# Patient Record
Sex: Female | Born: 1965
Health system: Southern US, Community
[De-identification: ages and names within clinical notes are randomized; demographics above are authoritative.]

## PROBLEM LIST (undated history)

## (undated) DIAGNOSIS — K5904 Chronic idiopathic constipation: Secondary | ICD-10-CM

## (undated) DIAGNOSIS — E669 Obesity, unspecified: Secondary | ICD-10-CM

## (undated) DIAGNOSIS — E785 Hyperlipidemia, unspecified: Secondary | ICD-10-CM

## (undated) DIAGNOSIS — I10 Essential (primary) hypertension: Secondary | ICD-10-CM

## (undated) DIAGNOSIS — R002 Palpitations: Secondary | ICD-10-CM

## (undated) DIAGNOSIS — E079 Disorder of thyroid, unspecified: Secondary | ICD-10-CM

## (undated) DIAGNOSIS — O009 Unspecified ectopic pregnancy without intrauterine pregnancy: Secondary | ICD-10-CM

## (undated) DIAGNOSIS — N393 Stress incontinence (female) (male): Secondary | ICD-10-CM

## (undated) HISTORY — DX: Unspecified ectopic pregnancy without intrauterine pregnancy: O00.90

## (undated) HISTORY — PX: DOPPLER ECHOCARDIOGRAPHY: SHX263

## (undated) HISTORY — DX: Essential (primary) hypertension: I10

## (undated) HISTORY — DX: Hyperlipidemia, unspecified: E78.5

## (undated) HISTORY — PX: OTHER SURGICAL HISTORY: SHX169

## (undated) HISTORY — DX: Chronic idiopathic constipation: K59.04

## (undated) HISTORY — DX: Obesity, unspecified: E66.9

## (undated) HISTORY — DX: Palpitations: R00.2

## (undated) HISTORY — PX: CHOLECYSTECTOMY: SHX55

---

## 1988-08-14 HISTORY — PX: LAPAROSCOPIC ENDOMETRIOSIS FULGURATION: SUR769

## 1999-05-25 ENCOUNTER — Other Ambulatory Visit: Admission: RE | Admit: 1999-05-25 | Discharge: 1999-05-25 | Payer: Self-pay | Admitting: Gynecology

## 2000-06-01 ENCOUNTER — Other Ambulatory Visit: Admission: RE | Admit: 2000-06-01 | Discharge: 2000-06-01 | Payer: Self-pay | Admitting: Gynecology

## 2001-02-19 ENCOUNTER — Ambulatory Visit (HOSPITAL_COMMUNITY): Admission: RE | Admit: 2001-02-19 | Discharge: 2001-02-19 | Payer: Self-pay | Admitting: Internal Medicine

## 2001-02-19 ENCOUNTER — Encounter: Payer: Self-pay | Admitting: Internal Medicine

## 2001-06-04 ENCOUNTER — Other Ambulatory Visit: Admission: RE | Admit: 2001-06-04 | Discharge: 2001-06-04 | Payer: Self-pay | Admitting: Gynecology

## 2002-07-15 ENCOUNTER — Other Ambulatory Visit: Admission: RE | Admit: 2002-07-15 | Discharge: 2002-07-15 | Payer: Self-pay | Admitting: Gynecology

## 2004-05-23 ENCOUNTER — Other Ambulatory Visit: Admission: RE | Admit: 2004-05-23 | Discharge: 2004-05-23 | Payer: Self-pay | Admitting: Gynecology

## 2004-08-18 ENCOUNTER — Ambulatory Visit (HOSPITAL_COMMUNITY): Admission: RE | Admit: 2004-08-18 | Discharge: 2004-08-18 | Payer: Self-pay | Admitting: Gynecology

## 2004-09-18 ENCOUNTER — Emergency Department (HOSPITAL_COMMUNITY): Admission: EM | Admit: 2004-09-18 | Discharge: 2004-09-18 | Payer: Self-pay | Admitting: Family Medicine

## 2005-06-20 ENCOUNTER — Ambulatory Visit (HOSPITAL_COMMUNITY): Admission: RE | Admit: 2005-06-20 | Discharge: 2005-06-20 | Payer: Self-pay | Admitting: Family Medicine

## 2006-03-12 ENCOUNTER — Ambulatory Visit (HOSPITAL_COMMUNITY): Admission: RE | Admit: 2006-03-12 | Discharge: 2006-03-12 | Payer: Self-pay | Admitting: Family Medicine

## 2006-07-24 ENCOUNTER — Inpatient Hospital Stay (HOSPITAL_COMMUNITY): Admission: RE | Admit: 2006-07-24 | Discharge: 2006-07-25 | Payer: Self-pay | Admitting: Neurosurgery

## 2006-08-06 ENCOUNTER — Other Ambulatory Visit: Admission: RE | Admit: 2006-08-06 | Discharge: 2006-08-06 | Payer: Self-pay | Admitting: Gynecology

## 2006-08-20 ENCOUNTER — Ambulatory Visit (HOSPITAL_COMMUNITY): Admission: RE | Admit: 2006-08-20 | Discharge: 2006-08-20 | Payer: Self-pay | Admitting: Gynecology

## 2006-09-07 ENCOUNTER — Ambulatory Visit (HOSPITAL_COMMUNITY): Admission: RE | Admit: 2006-09-07 | Discharge: 2006-09-07 | Payer: Self-pay | Admitting: Family Medicine

## 2007-10-31 ENCOUNTER — Ambulatory Visit (HOSPITAL_COMMUNITY): Admission: RE | Admit: 2007-10-31 | Discharge: 2007-10-31 | Payer: Self-pay | Admitting: Family Medicine

## 2009-10-25 ENCOUNTER — Emergency Department (HOSPITAL_COMMUNITY): Admission: EM | Admit: 2009-10-25 | Discharge: 2009-10-25 | Payer: Self-pay | Admitting: Emergency Medicine

## 2010-02-17 ENCOUNTER — Ambulatory Visit (HOSPITAL_COMMUNITY): Admission: RE | Admit: 2010-02-17 | Discharge: 2010-02-17 | Payer: Self-pay | Admitting: Family Medicine

## 2010-03-28 ENCOUNTER — Ambulatory Visit: Payer: Self-pay | Admitting: Gastroenterology

## 2010-03-28 ENCOUNTER — Encounter: Payer: Self-pay | Admitting: Urgent Care

## 2010-03-28 DIAGNOSIS — R109 Unspecified abdominal pain: Secondary | ICD-10-CM | POA: Insufficient documentation

## 2010-03-28 DIAGNOSIS — K5909 Other constipation: Secondary | ICD-10-CM | POA: Insufficient documentation

## 2010-03-28 DIAGNOSIS — I1A0 Resistant hypertension: Secondary | ICD-10-CM | POA: Insufficient documentation

## 2010-03-28 DIAGNOSIS — I10 Essential (primary) hypertension: Secondary | ICD-10-CM | POA: Insufficient documentation

## 2010-03-30 LAB — CONVERTED CEMR LAB
Eosinophils Absolute: 0.2 10*3/uL (ref 0.0–0.7)
HCT: 39.2 % (ref 36.0–46.0)
Lymphocytes Relative: 35 % (ref 12–46)
MCV: 99.7 fL (ref 78.0–100.0)
Monocytes Relative: 5 % (ref 3–12)
Neutrophils Relative %: 57 % (ref 43–77)
Platelets: 293 10*3/uL (ref 150–400)
RBC: 3.93 M/uL (ref 3.87–5.11)
RDW: 13.5 % (ref 11.5–15.5)
WBC: 7.1 10*3/uL (ref 4.0–10.5)

## 2010-04-08 ENCOUNTER — Encounter: Payer: Self-pay | Admitting: Gastroenterology

## 2010-04-12 ENCOUNTER — Ambulatory Visit (HOSPITAL_COMMUNITY): Admission: RE | Admit: 2010-04-12 | Discharge: 2010-04-12 | Payer: Self-pay | Admitting: Gastroenterology

## 2010-04-12 ENCOUNTER — Ambulatory Visit: Payer: Self-pay | Admitting: Gastroenterology

## 2010-09-13 NOTE — Letter (Signed)
Summary: TCS ORDER  TCS ORDER   Imported By: Rosine Beat 04/08/2010 14:29:05  _____________________________________________________________________  External Attachment:    Type:   Image     Comment:   External Document

## 2010-09-13 NOTE — Assessment & Plan Note (Signed)
Summary: ABD PAIN.SS   Visit Type:  Initial Consult Referring Provider:  Lenell Antu, PA Primary Care Provider:  Phillips Odor  Chief Complaint:  abd pain.  History of Present Illness: 45 y/o black female w/ RLQ/Right flank constant pain x over 1 year.  Seen GYN Dr Thamas Jaegers and had ultrasounds and has hx of endometriosis.  CT Abd/pelvis w/ IV/oral contrast on 02/17/10->small amt free pelvic fluid. Pain 10/10 at worst, worse before periods.  Constant dull aching with doubling over sharp pain intermittantly.  No aggrevating factors.  Not assoc w/ eating.  Pain worse w/defecating.  BMs once weekly, don't like to have BM due to pain.  Denies rectal bleeding or melena. Tried fiber and eating activia yogurt.  Naproxen 2-3 times per week.  Occ nausea, no vomiting.  Denies fever.  Wt loss feels intentional 25# x 6 months.  Pt notes changed diet to DASH diet and walking 1 hr every other day.  Denies heartburn, indigestion, dyysphagia, or odynophagia.    Current Problems (verified): 1)  Abdominal Pain  (ICD-789.00) 2)  Constipation, Chronic  (ICD-564.09) 3)  Hypertension  (ICD-401.9)  Current Medications (verified): 1)  Losartan Potassium-Hctz 100-25 Mg Tabs (Losartan Potassium-Hctz) .... Take 1 Tablet By Mouth Once A Day 2)  Naproxen .... As Needed  Allergies (verified): 1)  ! Doxycycline  Past History:  Past Medical History: CONSTIPATION, CHRONIC (ICD-564.09) HYPERTENSION (ICD-401.9)  Past Surgical History: Dr Thamas Jaegers laparoscopy endometriosis 1980s cholecystectomy 12-30-1996 Dr Lovell Sheehan ectopic pregnancy s/p tubal ligation 1995  Family History: No known family history of colorectal carcinoma, IBD, liver or chronic GI problems. m 2063[birth year]-31) healthy f (deceased 58) MVA 2 brothers/2 sisters healthy  Social History: divorced [employer] 2 daughters 83 & 26 Patient has never smoked.  Alcohol Use - no Illicit Drug Use - no Patient gets regular exercise. Smoking Status:  never Drug Use:   no Does Patient Exercise:  yes  Review of Systems General:  Denies fever, chills, sweats, anorexia, fatigue, weakness, malaise, and sleep disorder. CV:  Denies chest pains, angina, palpitations, syncope, dyspnea on exertion, orthopnea, PND, peripheral edema, and claudication. Resp:  Complains of cough; denies dyspnea at rest, dyspnea with exercise, sputum, wheezing, coughing up blood, and pleurisy; at night. GI:  Denies vomiting blood, jaundice, gas/bloating, and fecal incontinence. GU:  Complains of urinary incontinence; denies urinary burning, blood in urine, nocturnal urination, and urinary frequency; stress. MS:  Denies joint pain / LOM, joint swelling, joint stiffness, joint deformity, low back pain, muscle weakness, muscle cramps, muscle atrophy, leg pain at night, leg pain with exertion, and shoulder pain / LOM hand / wrist pain (CTS). Derm:  Denies rash, itching, dry skin, hives, moles, warts, and unhealing ulcers. Psych:  Denies depression, anxiety, memory loss, suicidal ideation, hallucinations, paranoia, phobia, and confusion. Heme:  Denies bruising, bleeding, and enlarged lymph nodes.  Vital Signs:  Patient profile:   45 year old female Height:      65.5 inches Weight:      175.50 pounds BMI:     28.86 Temp:     98.3 degrees F oral Pulse rate:   80 / minute BP sitting:   130 / 88  (left arm) Cuff size:   regular  Vitals Entered By: Cloria Spring LPN (March 28, 2010 11:06 AM)  Physical Exam  General:  Well developed, well nourished, no acute distress. Head:  Normocephalic and atraumatic. Eyes:  Sclera clear, no icterus. Ears:  Normal auditory acuity. Nose:  No deformity, discharge,  or lesions. Mouth:  No deformity or lesions, dentition normal. Neck:  Supple; no masses or thyromegaly. Lungs:  Clear throughout to auscultation. Heart:  Regular rate and rhythm; no murmurs, rubs,  or bruits. Abdomen:  Soft, mild RLQ tenderness on palp and nondistended. No masses,  hepatosplenomegaly or hernias noted. Normal bowel sounds.without guarding and without rebound.   Rectal:  deferred until time of colonoscopy.   Msk:  Symmetrical with no gross deformities. Normal posture. Pulses:  Normal pulses noted. Extremities:  No clubbing, cyanosis, edema or deformities noted. Neurologic:  Alert and  oriented x4;  grossly normal neurologically. Skin:  Intact without significant lesions or rashes. Cervical Nodes:  No significant cervical adenopathy. Psych:  Alert and cooperative. Normal mood and affect.  Impression & Recommendations:  Problem # 1:  ABDOMINAL PAIN (ICD-789.00) 45 y/o black female w/ greater than 1 yr hx of right lower quadrant pain that occasionally radiates to right flank.  Previous GYN eval & CT benign and reassuring.  Hx chronic constipation which may be the culprit (IBS), less likely referred back pain, endometriosis, chronic appendicitis or diverticulitis not picked up on CT.  Will check WBC ct.  Due for screening colonoscopy given her age.    Screeening colonoscopy to be performed by Dr. Jonette Eva in the near future.  I have discussed risks and benefits which include, but are not limited to, bleeding, infection, perforation, or medication reaction.  The patient agrees with this plan and consent will be obtained.  Orders: T-CBC w/Diff ([id number]) Consultation Level III (09811)  Problem # 2:  CONSTIPATION, CHRONIC (ICD-564.09) See #1  Patient Instructions: 1)  Constipation literature 2)  Dulcolax balance daily as needed constipation 3)  Continue fiber supplement of choice daily 4)  CBC 5)  Call if severe pain returns or to ER 6)  Offered pain meds, pt declined 7)  Limit naproxen use  Appended Document: ABD PAIN.SS JUNE 2011: 177 LBS  JULY 2011: [facility] CTAP w/-NAIAP

## 2010-10-30 LAB — CREATININE, SERUM: Creatinine, Ser: 0.83 mg/dL (ref 0.4–1.2)

## 2010-11-07 LAB — URINALYSIS, ROUTINE W REFLEX MICROSCOPIC
Bilirubin Urine: NEGATIVE
Glucose, UA: NEGATIVE mg/dL
Ketones, ur: NEGATIVE mg/dL
Protein, ur: NEGATIVE mg/dL
pH: 5.5 (ref 5.0–8.0)

## 2010-11-07 LAB — COMPREHENSIVE METABOLIC PANEL
AST: 21 U/L (ref 0–37)
Albumin: 4.1 g/dL (ref 3.5–5.2)
CO2: 30 mEq/L (ref 19–32)
Calcium: 9.2 mg/dL (ref 8.4–10.5)
GFR calc Af Amer: >60 mL/min (ref >60)
Glucose, Bld: 79 mg/dL (ref 70–99)
Potassium: 3.1 mEq/L — ABNORMAL LOW (ref 3.5–5.1)
Sodium: 139 mEq/L (ref 135–145)
Total Bilirubin: 0.7 mg/dL (ref 0.3–1.2)
Total Protein: 7.3 g/dL (ref 6.0–8.3)

## 2010-11-07 LAB — DIFFERENTIAL
Basophils Absolute: 0 10*3/uL (ref 0.0–0.1)
Basophils Relative: 1 % (ref 0–1)
Eosinophils Relative: 2 % (ref 0–5)

## 2010-11-07 LAB — D-DIMER, QUANTITATIVE: D-Dimer, Quant: 0.25 ug/mL-FEU (ref 0.00–0.48)

## 2010-11-07 LAB — CBC
Hemoglobin: 12.1 g/dL (ref 12.0–15.0)
MCHC: 34.9 g/dL (ref 30.0–36.0)
MCV: 94.2 fL (ref 78.0–100.0)
Platelets: 268 10*3/uL (ref 150–400)
RBC: 3.68 MIL/uL — ABNORMAL LOW (ref 3.87–5.11)

## 2010-11-07 LAB — POCT CARDIAC MARKERS
CKMB, poc: 1.2 ng/mL (ref 1.0–8.0)
Myoglobin, poc: 96.2 ng/mL (ref 12–200)

## 2010-11-07 LAB — URINE CULTURE

## 2010-12-30 NOTE — Op Note (Signed)
[name], [name]             ACCOUNT NO.:  [id number]   MEDICAL RECORD NO.:  [id number]          PATIENT TYPE:  OIB   LOCATION:  3011                         FACILITY:  [facility]   PHYSICIAN:  Clydene Fake, M.D.  DATE OF BIRTH:  [birth year]   DATE OF PROCEDURE:  07/23/2006  DATE OF DISCHARGE:                               OPERATIVE REPORT   DIAGNOSIS:  Herniated nucleus pulposus at C6-7, with left-sided  radiculopathy.   POSTOPERATIVE DIAGNOSIS:  Herniated nucleus pulposus at C6-7, with left-  sided radiculopathy.   PROCEDURE:  Anterior cervical decompression and diskectomy fusion C6-7,  with allograft bone __________ anterior cervical plate.   SURGEON:  Clydene Fake, M.D.   ASSISTANT:  Hilda Lias, M.D.   ANESTHESIA:  General endotracheal tube anesthesia.   ESTIMATED BLOOD LOSS:  Minimal.   BLOOD GIVEN:  None.   DRAINS:  None.   COMPLICATIONS:  None.   REASON FOR PROCEDURE:  The patient is a 45 year old woman who has had  neck and  left arm pain and numbness, with decreased sensation of left  C7 distribution.  MRI showing disk herniation of left side at C6-7.  The  patient brought for decompression and fusion.   PROCEDURE IN DETAIL:  The patient was brought into the operating room  and general anesthesia was induced.  The patient was placed in 10 pounds  of Holter traction; was prepped and draped in a sterile fashion.  Incision was injected 10 cc of 1% lidocaine with epinephrine.  Incision  was then made from the midline to the anterior border of the  sternocleidomastoid muscle on the left side.  The neck incision taken to  beneath platysma and hemostasis was obtained with Bovie cauterization.  Blunt dissection was taken to the anterior cervical fascia to the  anterior cervical spine, and needle was placed in 2 different  interspaces.  X-ray was obtained, and we really could not see where the  needles were.  We put a third needle in one  level higher, and  changed  the technique of the x-ray and took another x-ray.  We then could see  that the chop needle was at the 4-5 level.  The needles were removed and  the bottom needle, which was at the C6-7, the disk space was incised as  the needle was being removed.  Partial diskectomy was performed between  rongeurs and curets.  The Longus coli muscles were reflected laterally  on each side, and a self-retaining retractor was placed.  The disk space  was then again  incised with a 15 blade, and diskectomy continued with  pituitary rongeurs and curets.  Distraction pins were placed in the C6  and C7, and the interspace distracted.  Microscope was brought in for  microdissection; diskectomy was continued with curets and 1-2 mm  Kerrison punches.  We removed the posterior disk, posterior osteophytes  and posterior ligament.  Bilateral foraminotomies were performed,  decompressing the C7 roots.  We did see a disk herniation to the left  side; removed this.  Whe we were finished we had good central  decompression,  and bilateral nerve roots were well decompressed.  The  nerve hook could go out of the foramen easily.  We used curets to scrape  the endplates, and measured the disk space to be 6 mm and a 6 mm plate.  Then allograft bone was tapped into place; countersunk a couple of  millimeters.  We checked posterior to the graft, and there was plenty of  room between the posterior aspect of the graft and the dura.  The wound  was irrigated with antibiotic solution.  Distraction pins and  distraction were removed.  Hemostasis was obtained with Gelfoam and  thrombin.  A trestle anterior cervical plate was placed over the  anterior cervical spine.  Weight was removed from the traction, and then  2 screws placed in the C6, 2 in the C7; these were tightened down.  Another x-ray was obtained; we could see down to C5 and there was no  screw there.  We could not see the plates at the Z6-1 level.   Intraoperatively we had a good positioning of the bone plugs and  tightened the screws.  Retractors were removed.  We had good hemostasis.  We irrigated with antibiotic solution.  The platysma was closed with 3-0  Vicryl interrupted sutures.  Subcutaneous tissue closed with the same,  and skin closed with benzoin and Steri-Strips.  Dressing was placed.  The patient was placed back in the supine position.  He was placed in a  soft cervical collar, awakened from anesthesia and transferred to  recovery in stable condition.           ______________________________  Clydene Fake, M.D.     JRH/MEDQ  D:  07/23/2006  T:  07/23/2006  Job:  [id number]

## 2011-07-10 IMAGING — CR DG CHEST 1V PORT
1 series · 1 of 1 positions shown · non-contrast
Comparison: 07/23/2006

CLINICAL DATA: Chest pain/short of breath

PORTABLE CHEST - 1 VIEW

[view not recorded]
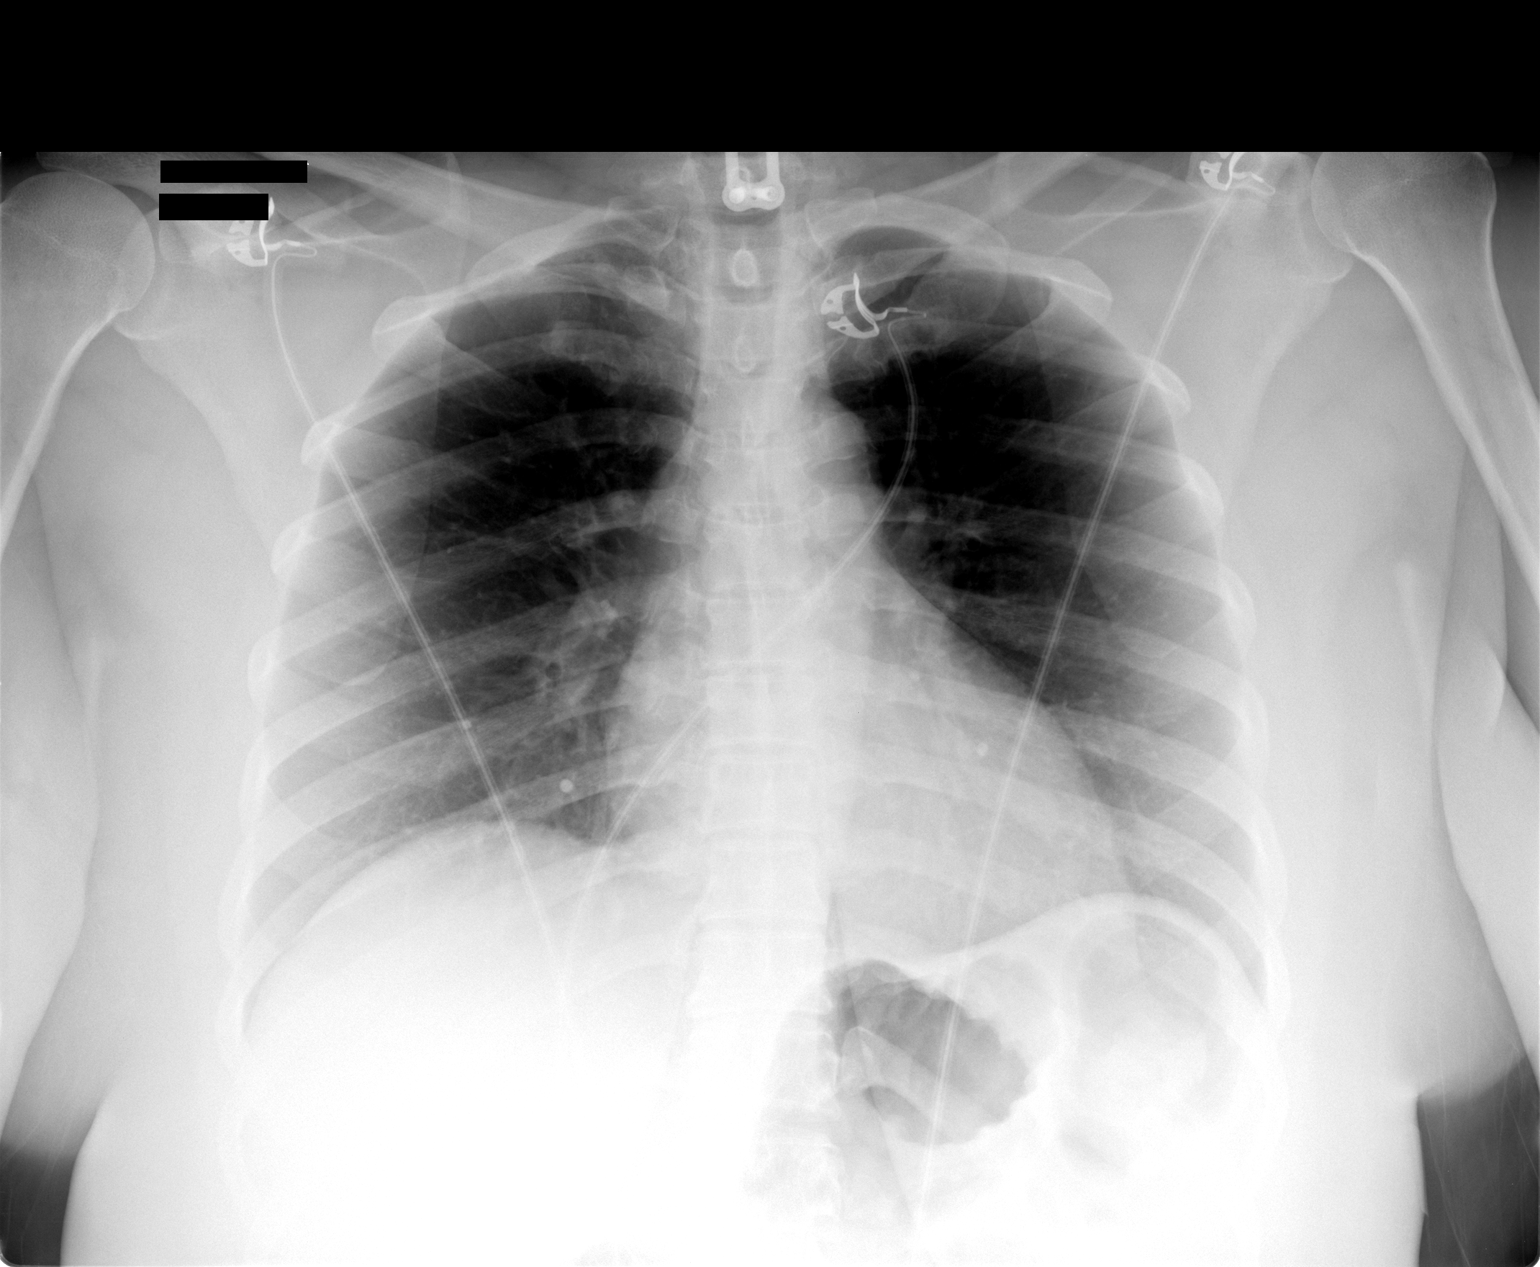

[1 of 1 positions shown; findings below may reference images not displayed]

FINDINGS: Heart and mediastinal contours normal.  Lungs clear.
Osseous structures intact in one-view.

Prior cervical fusion with plate
IMPRESSION: No active disease in one-view.

## 2011-09-14 ENCOUNTER — Encounter (HOSPITAL_COMMUNITY): Payer: Self-pay

## 2011-09-14 ENCOUNTER — Emergency Department (HOSPITAL_COMMUNITY)
Admission: EM | Admit: 2011-09-14 | Discharge: 2011-09-14 | Disposition: A | Discharge status: Home / Self Care | Payer: BC Managed Care – PPO | Attending: Emergency Medicine | Admitting: Emergency Medicine

## 2011-09-14 ENCOUNTER — Emergency Department (HOSPITAL_COMMUNITY): Payer: BC Managed Care – PPO

## 2011-09-14 ENCOUNTER — Other Ambulatory Visit: Payer: Self-pay

## 2011-09-14 DIAGNOSIS — R079 Chest pain, unspecified: Secondary | ICD-10-CM | POA: Insufficient documentation

## 2011-09-14 DIAGNOSIS — Z79899 Other long term (current) drug therapy: Secondary | ICD-10-CM | POA: Insufficient documentation

## 2011-09-14 DIAGNOSIS — R112 Nausea with vomiting, unspecified: Secondary | ICD-10-CM | POA: Insufficient documentation

## 2011-09-14 DIAGNOSIS — I10 Essential (primary) hypertension: Secondary | ICD-10-CM | POA: Insufficient documentation

## 2011-09-14 LAB — COMPREHENSIVE METABOLIC PANEL
ALT: 14 U/L (ref 0–35)
AST: 22 U/L (ref 0–37)
CO2: 25 mEq/L (ref 19–32)
Chloride: 101 mEq/L (ref 96–112)
GFR calc non Af Amer: >90 mL/min (ref >90)
Sodium: 140 mEq/L (ref 135–145)
Total Bilirubin: 0.4 mg/dL (ref 0.3–1.2)

## 2011-09-14 LAB — CBC
Hemoglobin: 13.5 g/dL (ref 12.0–15.0)
MCV: 93.5 fL (ref 78.0–100.0)
Platelets: 254 10*3/uL (ref 150–400)
RBC: 4.14 MIL/uL (ref 3.87–5.11)
WBC: 10.8 10*3/uL — ABNORMAL HIGH (ref 4.0–10.5)

## 2011-09-14 MED ORDER — ASPIRIN 81 MG PO CHEW
324.0000 mg | CHEWABLE_TABLET | Freq: Once | ORAL | Status: AC
  Administered 2011-09-14: 324 mg via ORAL
  Filled 2011-09-14 (×2): qty 4

## 2011-09-14 MED ORDER — ONDANSETRON HCL 4 MG/2ML IJ SOLN
4.0000 mg | Freq: Once | INTRAMUSCULAR | Status: AC
  Administered 2011-09-14: 4 mg via INTRAVENOUS
  Filled 2011-09-14 (×2): qty 2

## 2011-09-14 MED ORDER — SODIUM CHLORIDE 0.9 % IV SOLN: 20.0000 mL | INTRAVENOUS | Status: DC

## 2011-09-14 MED ORDER — PANTOPRAZOLE SODIUM 20 MG PO TBEC: 40.0000 mg | DELAYED_RELEASE_TABLET | Freq: Every day | ORAL | Status: DC

## 2011-09-14 MED ORDER — MORPHINE SULFATE 4 MG/ML IJ SOLN
4.0000 mg | Freq: Once | INTRAMUSCULAR | Status: AC
  Administered 2011-09-14: 4 mg via INTRAVENOUS
  Filled 2011-09-14 (×2): qty 1

## 2011-09-14 MED ORDER — NITROGLYCERIN 2 % TD OINT
1.0000 [in_us] | TOPICAL_OINTMENT | Freq: Four times a day (QID) | TRANSDERMAL | Status: DC
  Filled 2011-09-14: qty 1

## 2011-09-14 MED ORDER — ONDANSETRON 8 MG PO TBDP: 8.0000 mg | ORAL_TABLET | Freq: Three times a day (TID) | ORAL | Status: AC | PRN

## 2011-09-14 NOTE — ED Notes (Signed)
IV team paged.  

## 2011-09-14 NOTE — ED Provider Notes (Signed)
History     CSN: [id number]  Arrival date & time 09/14/11  1231   First MD Initiated Contact with Patient 09/14/11 1234      Chief Complaint  Patient presents with  . Chest Pain    (Consider location/radiation/quality/duration/timing/severity/associated sxs/prior treatment) HPI Patient presents to the emergency room with complaints of chest pain. Patient states it started last night. She woke up experiencing pain in her chest associated with an urge to vomit. Patient states since then she's been having trouble with nausea vomiting and continued pain in her chest the is somewhat better when she is lying flat and worse when she is sitting up. Patient denies any chest pain or shortness of breath. She has not had any diaphoresis. Pain does not radiate. She was seen at an urgent care and was given nitroglycerin which changed the pain from a 4-2. Patient states she's had this episode in the past. She was seen by cardiology and had a negative workup. She has not had any history of pulmonary embolism. She has had her gallbladder removed in the past. She denies abdominal pain or extremity swelling Past Medical History  Diagnosis Date  . Hypertension     Past Surgical History  Procedure Date  . Cholecystectomy     Family History  Problem Relation Age of Onset  . Hypertension Mother   . Hypertension Father     History  Substance Use Topics  . Smoking status: Not on file  . Smokeless tobacco: Not on file  . Alcohol Use: No    OB History    Grav Para Term Preterm Abortions TAB SAB Ect Mult Living                  Review of Systems  All other systems reviewed and are negative.    Allergies  Doxycycline  Home Medications   Current Outpatient Rx  Name Route Sig Dispense Refill  . ATORVASTATIN CALCIUM 20 MG PO TABS Oral Take 20 mg by mouth daily.    [name] Kitchen LOSARTAN POTASSIUM-HCTZ 100-25 MG PO TABS Oral Take 1 tablet by mouth daily.    [name] Kitchen SOLIFENACIN SUCCINATE 5 MG PO TABS Oral  Take 10 mg by mouth daily.      BP 125/75  Temp(Src) 98.1 F (36.7 C) (Oral)  Resp 16  Ht 5\' 6"  (1.676 m)  Wt 192 lb (87.091 kg)  BMI 30.99 kg/m2  SpO2 100%  Physical Exam  Nursing note and vitals reviewed. Constitutional: She appears well-developed and well-nourished. No distress.  HENT:  Head: Normocephalic and atraumatic.  Right Ear: External ear normal.  Left Ear: External ear normal.  Eyes: Conjunctivae are normal. Right eye exhibits no discharge. Left eye exhibits no discharge. No scleral icterus.  Neck: Neck supple. No tracheal deviation present.  Cardiovascular: Normal rate, regular rhythm and intact distal pulses.   Pulmonary/Chest: Effort normal and breath sounds normal. No stridor. No respiratory distress. She has no wheezes. She has no rales. She exhibits tenderness (mild tenderness).  Abdominal: Soft. Bowel sounds are normal. She exhibits no distension. There is no tenderness. There is no rebound and no guarding.  Musculoskeletal: She exhibits no edema and no tenderness.  Neurological: She is alert. She has normal strength. No sensory deficit. Cranial nerve deficit:  no gross defecits noted. She exhibits normal muscle tone. She displays no seizure activity. Coordination normal.  Skin: Skin is warm and dry. No rash noted.  Psychiatric: She has a normal mood and affect.  ED Course  Procedures (including critical care time)  Date: 09/14/2011  Rate: 84  Rhythm: normal sinus rhythm  QRS Axis: normal  Intervals: QT prolonged  ST/T Wave abnormalities: normal  Conduction Disutrbances:none  Narrative Interpretation:   Old EKG Reviewed: unchanged   Labs Reviewed  CBC - Abnormal; Notable for the following:    WBC 10.8 (*)    All other components within normal limits  COMPREHENSIVE METABOLIC PANEL - Abnormal; Notable for the following:    Potassium 3.2 (*)    All other components within normal limits  POCT I-STAT TROPONIN I  PROTIME-INR  APTT   Dg Chest 2  View  09/14/2011  *RADIOLOGY REPORT*  Clinical Data: Chest pain  CHEST - 2 VIEW  Comparison: 10/25/2009  Findings: The heart size and mediastinal contours are within normal limits.  Both lungs are clear.  The visualized skeletal structures are unremarkable.  IMPRESSION: Negative exam.  Original Report Authenticated By: Rosealee Albee, M.D.     1. Chest pain       MDM   Pt with atypical chest pain associated with N/V.  Suspect this could be esophageal in nature.  Will dc home on antacids and have her follow up with PCP.  Pt has had a normal stress test in the past.  Ongoing since yesterday with reassuring EKG and cardiac markers.  Findings discussed with patient and husband.       Celene Kras, MD 09/14/11 [phone]

## 2011-09-14 NOTE — ED Notes (Signed)
Pharmacy called for nitro ointment

## 2011-09-14 NOTE — ED Notes (Signed)
Lt. Side chest pain began last night, intermittent, woke up with chest pain, went to work, and then left to go to [facility].  Pt brought to [place] from [facility], pt. Received 1 nitro brought the pain from a 4 to a 2.  But on arrival pt. Is at a 4 with lt. Side chest pressure into lt. Arm  Pt.s skin is warm and dry, resp. E/u.  Pt. Reports that she is nauseated with  Intermittent  vomiting

## 2012-05-29 ENCOUNTER — Other Ambulatory Visit: Payer: Self-pay | Admitting: Gynecology

## 2012-05-29 ENCOUNTER — Other Ambulatory Visit: Payer: Self-pay | Admitting: Obstetrics & Gynecology

## 2012-05-29 DIAGNOSIS — R928 Other abnormal and inconclusive findings on diagnostic imaging of breast: Secondary | ICD-10-CM

## 2012-05-31 ENCOUNTER — Ambulatory Visit
Admission: RE | Admit: 2012-05-31 | Discharge: 2012-05-31 | Disposition: A | Discharge status: Home / Self Care | Payer: BC Managed Care – PPO | Source: Ambulatory Visit | Attending: Gynecology | Admitting: Gynecology

## 2012-05-31 DIAGNOSIS — R928 Other abnormal and inconclusive findings on diagnostic imaging of breast: Secondary | ICD-10-CM

## 2013-01-21 ENCOUNTER — Emergency Department (HOSPITAL_COMMUNITY): Payer: BC Managed Care – PPO

## 2013-01-21 ENCOUNTER — Encounter (HOSPITAL_COMMUNITY): Payer: Self-pay

## 2013-01-21 ENCOUNTER — Emergency Department (HOSPITAL_COMMUNITY)
Admission: EM | Admit: 2013-01-21 | Discharge: 2013-01-21 | Disposition: A | Discharge status: Home / Self Care | Payer: BC Managed Care – PPO | Attending: Emergency Medicine | Admitting: Emergency Medicine

## 2013-01-21 DIAGNOSIS — E079 Disorder of thyroid, unspecified: Secondary | ICD-10-CM | POA: Insufficient documentation

## 2013-01-21 DIAGNOSIS — R51 Headache: Secondary | ICD-10-CM | POA: Insufficient documentation

## 2013-01-21 DIAGNOSIS — I10 Essential (primary) hypertension: Secondary | ICD-10-CM | POA: Insufficient documentation

## 2013-01-21 DIAGNOSIS — R0789 Other chest pain: Secondary | ICD-10-CM | POA: Insufficient documentation

## 2013-01-21 DIAGNOSIS — N393 Stress incontinence (female) (male): Secondary | ICD-10-CM | POA: Insufficient documentation

## 2013-01-21 DIAGNOSIS — E876 Hypokalemia: Secondary | ICD-10-CM | POA: Insufficient documentation

## 2013-01-21 DIAGNOSIS — Z79899 Other long term (current) drug therapy: Secondary | ICD-10-CM | POA: Insufficient documentation

## 2013-01-21 DIAGNOSIS — E785 Hyperlipidemia, unspecified: Secondary | ICD-10-CM | POA: Insufficient documentation

## 2013-01-21 HISTORY — DX: Disorder of thyroid, unspecified: E07.9

## 2013-01-21 HISTORY — DX: Stress incontinence (female) (male): N39.3

## 2013-01-21 LAB — CBC WITH DIFFERENTIAL/PLATELET
Eosinophils Relative: 3 % (ref 0–5)
HCT: 35 % — ABNORMAL LOW (ref 36.0–46.0)
Hemoglobin: 11.9 g/dL — ABNORMAL LOW (ref 12.0–15.0)
Lymphocytes Relative: 32 % (ref 12–46)
Lymphs Abs: 2.8 10*3/uL (ref 0.7–4.0)
MCV: 94.9 fL (ref 78.0–100.0)
Monocytes Absolute: 0.4 10*3/uL (ref 0.1–1.0)
RBC: 3.69 MIL/uL — ABNORMAL LOW (ref 3.87–5.11)
WBC: 8.8 10*3/uL (ref 4.0–10.5)

## 2013-01-21 LAB — COMPREHENSIVE METABOLIC PANEL
ALT: 13 U/L (ref 0–35)
CO2: 29 mEq/L (ref 19–32)
Calcium: 9.2 mg/dL (ref 8.4–10.5)
Creatinine, Ser: 0.87 mg/dL (ref 0.50–1.10)
GFR calc Af Amer: 89 mL/min — ABNORMAL LOW (ref >90)
GFR calc non Af Amer: 77 mL/min — ABNORMAL LOW (ref >90)
Glucose, Bld: 98 mg/dL (ref 70–99)
Sodium: 140 mEq/L (ref 135–145)

## 2013-01-21 LAB — D-DIMER, QUANTITATIVE: D-Dimer, Quant: <0.27 ug/mL-FEU (ref 0.00–0.48)

## 2013-01-21 LAB — POCT I-STAT TROPONIN I: Troponin i, poc: 0 ng/mL (ref 0.00–0.08)

## 2013-01-21 MED ORDER — AMLODIPINE BESYLATE 10 MG PO TABS: 5.0000 mg | ORAL_TABLET | Freq: Every day | ORAL | Status: DC

## 2013-01-21 MED ORDER — METOCLOPRAMIDE HCL 5 MG/ML IJ SOLN
10.0000 mg | Freq: Once | INTRAMUSCULAR | Status: AC
  Administered 2013-01-21: 10 mg via INTRAVENOUS
  Filled 2013-01-21: qty 2

## 2013-01-21 MED ORDER — MORPHINE SULFATE 2 MG/ML IJ SOLN
2.0000 mg | Freq: Once | INTRAMUSCULAR | Status: AC
  Administered 2013-01-21: 2 mg via INTRAVENOUS
  Filled 2013-01-21: qty 1

## 2013-01-21 MED ORDER — DIPHENHYDRAMINE HCL 50 MG/ML IJ SOLN
25.0000 mg | Freq: Once | INTRAMUSCULAR | Status: AC
  Administered 2013-01-21: 25 mg via INTRAVENOUS
  Filled 2013-01-21: qty 1

## 2013-01-21 MED ORDER — SODIUM CHLORIDE 0.9 % IV SOLN
INTRAVENOUS | Status: DC
  Administered 2013-01-21: 18:00:00 via INTRAVENOUS

## 2013-01-21 MED ORDER — POTASSIUM CHLORIDE CRYS ER 20 MEQ PO TBCR
40.0000 meq | EXTENDED_RELEASE_TABLET | Freq: Once | ORAL | Status: AC
  Administered 2013-01-21: 40 meq via ORAL
  Filled 2013-01-21: qty 2

## 2013-01-21 NOTE — ED Provider Notes (Signed)
History     CSN: [id number]  Arrival date & time 01/21/13  1615   First MD Initiated Contact with Patient 01/21/13 1624      Chief Complaint  Patient presents with  . Chest Pain  . Headache    (Consider location/radiation/quality/duration/timing/severity/associated sxs/prior treatment) Patient is a 47 y.o. female presenting with chest pain and headaches. The history is provided by the patient.  Chest Pain Associated symptoms: headache   Headache  patient here complaining of a three-week history of headache that is constant throughout the entire day and located in the frontal portion of her scalp. No fever or chills. No nausea vomiting. Denies any photophobia or phonophobia has used over-the-counter medications without relief. Saw her Dr. today was sent here for further evaluation. Patient also to Dr. that she did have some atypical chest pain that last for seconds to minutes as they're nonexertional and not associated with diaphoresis and dyspnea. Her physician data EKG in the office and patient was sent here for further evaluation. She is also given nitroglycerin which made her headache worse and had no response to her chest discomfort. She denies any prior history of coronary artery disease. No recent cough or congestion. No leg pain, swelling, or recent travel history.  Past Medical History  Diagnosis Date  . Hypertension   . Hyperlipidemia   . Thyroid disease   . Stress incontinence     Past Surgical History  Procedure Laterality Date  . Cholecystectomy      Family History  Problem Relation Age of Onset  . Hypertension Mother   . Hypertension Father     History  Substance Use Topics  . Smoking status: Never Smoker   . Smokeless tobacco: Not on file  . Alcohol Use: No    OB History   Grav Para Term Preterm Abortions TAB SAB Ect Mult Living                  Review of Systems  Cardiovascular: Positive for chest pain.  Neurological: Positive for headaches.   All other systems reviewed and are negative.    Allergies  Doxycycline  Home Medications   Current Outpatient Rx  Name  Route  Sig  Dispense  Refill  . atorvastatin (LIPITOR) 20 MG tablet   Oral   Take 20 mg by mouth daily.         [name] Kitchen losartan-hydrochlorothiazide (HYZAAR) 100-25 MG per tablet   Oral   Take 1 tablet by mouth daily.         [name] Kitchen EXPIRED: pantoprazole (PROTONIX) 20 MG tablet   Oral   Take 2 tablets (40 mg total) by mouth daily.   14 tablet   0   . solifenacin (VESICARE) 5 MG tablet   Oral   Take 10 mg by mouth daily.           BP 155/95  Pulse 88  Resp 20  SpO2 100%  LMP 01/13/2013  Physical Exam  Nursing note and vitals reviewed. Constitutional: She is oriented to person, place, and time. She appears well-developed and well-nourished.  Non-toxic appearance. No distress.  HENT:  Head: Normocephalic and atraumatic.  Eyes: Conjunctivae, EOM and lids are normal. Pupils are equal, round, and reactive to light.  Neck: Normal range of motion. Neck supple. No tracheal deviation present. No mass present.  Cardiovascular: Normal rate, regular rhythm and normal heart sounds.  Exam reveals no gallop.   No murmur heard. Pulmonary/Chest: Effort normal and breath sounds normal.  No stridor. No respiratory distress. She has no decreased breath sounds. She has no wheezes. She has no rhonchi. She has no rales.  Abdominal: Soft. Normal appearance and bowel sounds are normal. She exhibits no distension. There is no tenderness. There is no rebound and no CVA tenderness.  Musculoskeletal: Normal range of motion. She exhibits no edema and no tenderness.  Neurological: She is alert and oriented to person, place, and time. She has normal strength. No cranial nerve deficit or sensory deficit. GCS eye subscore is 4. GCS verbal subscore is 5. GCS motor subscore is 6.  Skin: Skin is warm and dry. No abrasion and no rash noted.  Psychiatric: She has a normal mood and affect.  Her speech is normal and behavior is normal.    ED Course  Procedures (including critical care time)  Labs Reviewed  CBC WITH DIFFERENTIAL  COMPREHENSIVE METABOLIC PANEL   No results found.   No diagnosis found.    MDM   Date: 01/21/2013  Rate: 105  Rhythm: sinus tachycardia  QRS Axis: normal  Intervals: normal  ST/T Wave abnormalities: normal  Conduction Disutrbances:none  Narrative Interpretation:   Old EKG Reviewed: none available   7:01 PM Patient given medications for headache here and she does flow better. Mild hypokalemia noted and her potassium was replaced. I reviewed the note from her physician's office and the plan is that she be placed on Norvasc 5 mg at night and he is already schedule an outpatient stress test for her. I doubt that she is having ACS at this time or that her current symptoms are from coronary artery disease. Her headache is left from mild hypertension. Repeat neurological assessment at time of discharge remained stable.       Toy Baker, MD 01/21/13 [phone]

## 2013-01-21 NOTE — ED Notes (Signed)
Pt reports having a headache constantly for 2 weeks, was at her pmd today--they did 2 ekg's and was told to come to the er for eval.  Pt has been sob at times, having nausea and sweating at times.  Pain radiates to left arm at times.

## 2013-01-29 ENCOUNTER — Ambulatory Visit (INDEPENDENT_AMBULATORY_CARE_PROVIDER_SITE_OTHER): Payer: BC Managed Care – PPO | Admitting: Cardiology

## 2013-01-29 ENCOUNTER — Encounter: Payer: Self-pay | Admitting: Cardiology

## 2013-01-29 VITALS — BP 130/102 | HR 76 | Ht 65.0 in | Wt 189.9 lb

## 2013-01-29 DIAGNOSIS — R002 Palpitations: Secondary | ICD-10-CM

## 2013-01-29 DIAGNOSIS — R011 Cardiac murmur, unspecified: Secondary | ICD-10-CM

## 2013-01-29 DIAGNOSIS — I10 Essential (primary) hypertension: Secondary | ICD-10-CM

## 2013-01-29 DIAGNOSIS — R0789 Other chest pain: Secondary | ICD-10-CM

## 2013-01-29 DIAGNOSIS — R55 Syncope and collapse: Secondary | ICD-10-CM

## 2013-01-29 NOTE — Patient Instructions (Addendum)
Your physician wants you to follow-up in 6-8 weeks.  Your physician has requested that you have an echocardiogram. Echocardiography is a painless test that uses sound waves to create images of your heart. It provides your doctor with information about the size and shape of your heart and how well your heart's chambers and valves are working. This procedure takes approximately one hour. There are no restrictions for this procedure.   Your physician has recommended that you wear an event monitor. Event monitors are medical devices that record the heart's electrical activity. Doctors most often [place] these monitors to diagnose arrhythmias. Arrhythmias are problems with the speed or rhythm of the heartbeat. The monitor is a small, portable device. You can wear one while you do your normal daily activities. This is usually used to diagnose what is causing palpitations/syncope (passing out).

## 2013-02-01 ENCOUNTER — Encounter: Payer: Self-pay | Admitting: Cardiology

## 2013-02-01 DIAGNOSIS — R011 Cardiac murmur, unspecified: Secondary | ICD-10-CM | POA: Insufficient documentation

## 2013-02-01 DIAGNOSIS — R0789 Other chest pain: Secondary | ICD-10-CM | POA: Insufficient documentation

## 2013-02-01 DIAGNOSIS — R002 Palpitations: Secondary | ICD-10-CM | POA: Insufficient documentation

## 2013-02-01 NOTE — Assessment & Plan Note (Signed)
My overall impression is this is probably unlikely to be anginal in nature, she walks around and denies been getting worse with exertion. It's more the tachycardias but is bothering her then the discomfort in her chest and she is notices her heart racing and he takes makes her short of breath. None of this is exertional. I do not feel this time the followup evaluation for coronary ischemia is warranted based on her symptoms. We'll simply see what the echocardiogram and monitor show.

## 2013-02-01 NOTE — Progress Notes (Signed)
Patient ID: [name], female   DOB: 12/23/1965, 47 y.o.   MRN: [id number]  Clinic Note: HPI: [name] is a 47 y.o. female with a PMH below who presents today for evaluation atypical chest discomfort associated with palpitations. Referred back by her primary physician to evaluate his symptoms.. She was last seen here in this office by Dr. Lynnea [name] in March 2011. At that time she is being evaluated for atypical chest discomfort and had a nuclear perfusion stress test and echocardiogram done there were relatively normal. The main focus of that visit was then controlling her blood pressure. She was to his loss of followup since then.  Interval History: Keep Center position with the 12 weeks of intermittent episodes of chest discomfort radiating to left arm the belly was somewhat squeezing. I talked her about this she didn't really know much about that she really noted more only if feeling her heart racing and skipping beats but if bursts of rapid heart rates. On the saline it worse or better by any activity. According to her her main complaint was headache that was associated with poorly controlled hypertension. She was started amlodipine and her symptoms have improved. It seems like most of her discomfort in her chest it was associated with these episodes of palpitations. She says they happen relatively frequently but not every day. The last anywhere from a few minutes to up to 20-30 minutes. They usually occur when she's relaxing and feels as a tightness and palpitations the tight ischemic occasionally goes for the left arm. It usually happens which is at work better when she is up walking around. Rapid heart rates do make her somewhat short of breath and some Versed to actually cut with a symptom incontinence GERD with her architecture breath is a defect palpitations. She gets a little lightheaded with that but not every time he every couple days or so.  The important features that  she does not notice that the tightness in her chest is worse with exertion it gets better with exertion. She denies any dyspnea on exertion but just to see what her heart was racing. Denies any edema, but has had some symptoms that may be considered PND and orthopnea but is mostly that she gets the heart racing when she lays down not when she is short of breath and tach her heart races she is short of breath. No syncope or near syncope type symptoms and no TIA or amaurosis fugax symptoms.  Past Medical History  Diagnosis Date  . Hypertension   . Hyperlipidemia   . Thyroid disease   . Stress incontinence     Prior Cardiac Evaluation and Past Surgical History: Past Surgical History  Procedure Laterality Date  . Cholecystectomy    . Doppler echocardiography  March 2011    Normal EF, essentially normal  . Treadmill myoview nuclear stress test  March 2011    6 minutes, 7 METS; Diaphragmatic attenuation otherwise no evidence of ischemia or infarction    Allergies  Allergen Reactions  . Doxycycline Itching and Rash   -- Just started Amlodipine last week.  Has noted improvement in BP.   Current Outpatient Prescriptions  Medication Sig Dispense Refill  . ALPRAZolam (XANAX) 0.5 MG tablet Take 0.5 mg by mouth 3 (three) times daily as needed for sleep.      [name] Kitchen amLODipine (NORVASC) 10 MG tablet Take 0.5 tablets (5 mg total) by mouth daily.  30 tablet  0  . aspirin EC 81  MG tablet Take 81 mg by mouth daily.      [name] Kitchen atorvastatin (LIPITOR) 20 MG tablet Take 20 mg by mouth at bedtime.       . cyclobenzaprine (FLEXERIL) 10 MG tablet Take 10 mg by mouth 3 (three) times daily as needed for muscle spasms.      [name] Kitchen levothyroxine (SYNTHROID, LEVOTHROID) 25 MCG tablet Take 25 mcg by mouth daily before breakfast.      . Linaclotide 290 MCG CAPS Take 290 mcg by mouth at bedtime.       . naproxen (NAPROSYN) 500 MG tablet Take 500 mg by mouth 2 (two) times daily.      [name] Kitchen olmesartan (BENICAR) 40 MG tablet Take 40  mg by mouth daily.      . potassium chloride SA (K-DUR,KLOR-CON) 20 MEQ tablet Take 20 mEq by mouth 2 (two) times daily.       No current facility-administered medications for this visit.    History   Social History  . Marital Status: Divorced    Spouse Name: N/A    Number of Children: 2  . Years of Education: College   Occupational History  . Customer service Other    [name]   Social History Main Topics  . Smoking status: Never Smoker   . Smokeless tobacco: Not on file  . Alcohol Use: No  . Drug Use: No  . Sexually Active: No   Other Topics Concern  . Not on file   Social History Narrative   Remain since her last visit. Her husband and is Emily Needle.      Is now working on exercising regularly doing walks for 45 to 60 minutes a day 5 days a week.    ROS: A comprehensive Review of Systems - Negative except Pertinent positives noted above. No melena, hematochezia or hematuria. No GERD-type symptoms. No tissue area or hematuria. No weakness or numbness. She has had a headache that is improved with Norvasc  PHYSICAL EXAM BP 130/102  Pulse 76  Ht 5\' 5"  (1.651 m)  Wt 189 lb 14.4 oz (86.138 kg)  BMI 31.6 kg/m2  LMP 01/13/2013 General appearance: alert, cooperative, no distress and Pleasant mood and affect. Neck: no carotid bruit and no JVD Lungs: clear to auscultation bilaterally, normal percussion bilaterally and Nonlabored, good air movement. Heart: normal apical impulse, regular rate and rhythm, S1, S2 normal, no S3 or S4 and systolic murmur: early systolic 1/6, low pitch and harsh at lower left sternal border Abdomen: soft, non-tender; bowel sounds normal; no masses,  no organomegaly Extremities: extremities normal, atraumatic, no cyanosis or edema, no edema, redness or tenderness in the calves or thighs and no ulcers, gangrene or trophic changes Pulses: 2+ and symmetric Neurologic: Alert and oriented X 3, normal strength and tone. Normal symmetric reflexes. Normal  coordination and gait  [id number]:[id number] today: Yes Rate: 76 , Rhythm: Normal sinus rhythm; otherwise normal ECG  Recent Labs:  ASSESSMENT: 47 year old woman not seen in our office since 2011, who returns for evaluation of episodes of palpitations that cause of small amount of discomfort in her chest. This I think overall her symptoms have improved since been on Norvasc, and the chest discomfort was not anginal in nature. It was at rest made better by exertion. It lasted 20 minutes at a time, her symptoms very unlikely to be angina.. The palpitations have gotten better assess her headache.. She also has a systolic cardiac murmur.  Palpitations - Plan: Cardiac event monitor, 2D Echocardiogram without  contrast  Murmur - Plan: Cardiac event monitor, 2D Echocardiogram without contrast  HYPERTENSION  Chest discomfort at rest improved with exertion  PLAN: Per problem list. Orders Placed This Encounter  Procedures  . Cardiac event monitor    Standing Status: Future     Number of Occurrences:      Standing Expiration Date: 01/29/2014  . 2D Echocardiogram without contrast    Standing Status: Future     Number of Occurrences:      Standing Expiration Date: 01/29/2014    Scheduling Instructions:     Near syncope    Order Specific Question:  Type of Echo    Answer:  Complete    Order Specific Question:  Where should this test be performed    Answer:  [facility]    Order Specific Question:  Reason for exam-Echo    Answer:  Murmur  785.2    Order Specific Question:  Reason for exam-Echo    Answer:  Other - See Comments Section    Followup: 6-8 weeks  Simran Mannis W, M.D., M.S. [facility] [address] [place], [place]  [address]  [phone] Pager # [phone] 11:22 PM

## 2013-02-01 NOTE — Assessment & Plan Note (Signed)
Not sure this is just frequent ectopy versus a true arrhythmia. Hopefully these are occurring at enough frequency to pick it up on a cardiac monitor. All of her work articular monitor with a plan to wear for a month. If we get plenty of data the first few weeks, we can discontinue sooner.

## 2013-02-01 NOTE — Assessment & Plan Note (Addendum)
Diastolic pressure is little high today. If it would have room to use him to rate control agent depending on her palpitations and is really are. She is currently on amlodipine plus Benicar.

## 2013-02-01 NOTE — Assessment & Plan Note (Signed)
There is no comment of a murmur when she saw Dr. Lynnea Ferrier back in 2011. I did hear a soft murmur on exam. With palpitations on his shows no evidence of any mitral prolapse or any more significant valvular lesion.

## 2013-02-11 HISTORY — PX: OTHER SURGICAL HISTORY: SHX169

## 2013-02-27 ENCOUNTER — Ambulatory Visit (HOSPITAL_COMMUNITY): Payer: BC Managed Care – PPO

## 2013-02-27 ENCOUNTER — Ambulatory Visit (HOSPITAL_COMMUNITY)
Admission: RE | Admit: 2013-02-27 | Discharge: 2013-02-27 | Disposition: A | Discharge status: Home / Self Care | Payer: BC Managed Care – PPO | Source: Ambulatory Visit | Attending: Cardiology | Admitting: Cardiology

## 2013-02-27 DIAGNOSIS — E785 Hyperlipidemia, unspecified: Secondary | ICD-10-CM | POA: Insufficient documentation

## 2013-02-27 DIAGNOSIS — I10 Essential (primary) hypertension: Secondary | ICD-10-CM | POA: Insufficient documentation

## 2013-02-27 DIAGNOSIS — R011 Cardiac murmur, unspecified: Secondary | ICD-10-CM

## 2013-02-27 DIAGNOSIS — R55 Syncope and collapse: Secondary | ICD-10-CM | POA: Insufficient documentation

## 2013-02-27 DIAGNOSIS — R002 Palpitations: Secondary | ICD-10-CM

## 2013-02-27 NOTE — Progress Notes (Signed)
[facility]   2D echo completed 02/27/2013.   Veda Canning, RDCS

## 2013-03-03 ENCOUNTER — Ambulatory Visit (INDEPENDENT_AMBULATORY_CARE_PROVIDER_SITE_OTHER): Payer: BC Managed Care – PPO | Admitting: Cardiology

## 2013-03-03 VITALS — BP 110/70 | HR 72 | Ht 65.0 in | Wt 188.7 lb

## 2013-03-03 DIAGNOSIS — R0789 Other chest pain: Secondary | ICD-10-CM

## 2013-03-03 DIAGNOSIS — R011 Cardiac murmur, unspecified: Secondary | ICD-10-CM

## 2013-03-03 DIAGNOSIS — I10 Essential (primary) hypertension: Secondary | ICD-10-CM

## 2013-03-03 DIAGNOSIS — R002 Palpitations: Secondary | ICD-10-CM

## 2013-03-04 NOTE — Patient Instructions (Addendum)
Your physician has requested that you have en exercise stress myoview. For further information please visit [email or web address] Please follow instruction sheet, as given.  Your physician recommends that you schedule a follow-up appointment in after [facility]

## 2013-03-09 ENCOUNTER — Encounter: Payer: Self-pay | Admitting: Cardiology

## 2013-03-09 NOTE — Assessment & Plan Note (Signed)
Probably a flow murmur since her echocardiogram did not really show any notable valvular abnormalities.

## 2013-03-09 NOTE — Assessment & Plan Note (Signed)
Interestingly, now she notes the discomfort worsens with exertion.   Her symptoms are really not fully consistent with angina, however, she does not seem to be reassured by that concept.  At this point think in order to answer her question fully and completely, I think I would prefer to use A Myoview Stress Test as opposed to a simple treadmill stress test for better sensitivity and specificity.

## 2013-03-09 NOTE — Assessment & Plan Note (Addendum)
Requesting relatively well controlled with blood pressure control. In fact, now, wouldn't have blood pressure room to use a beta blocker.  She states to be very sensitive to stress.  Plan tachycardia.  It does not appear, however, that she has inappropriate tachycardia.

## 2013-03-09 NOTE — Progress Notes (Signed)
Patient ID: [name], female   DOB: March 21, 1966, 47 y.o.   MRN: [id number]  Clinic Note: HPI: [name] is a 47 y.o. female with a PMH of hypertension, hyperlipidemia, and hypothyroidism, who I am seeing now in followup after her echocardiogram and event monitor to evaluate atypical chest discomfort associated with palpitations.  Her prior cardiac evaluation was in March 2 11 with a normal echocardiogram and Myoview Stress Test. The plan with her last visit was also to have a stress test performed, this however, was not appropriate ordered, and therefore, did not occur.  She did have the echocardiogram, which showed essentially normal cardiac function and no valvular lesions.  Her monitor failed to show any significant arrhythmias.  His only sinus tachycardia, which, associated with her discomfort and dizziness.    Interval History:  For the most part.  She says a lot of her discomfort to palpation significantly improved with initiation of amlodipine.  Blood pressures been much better controlled of late.  She does note intermittent palpitations, but much improved.  Still spasms, mild exertional dyspnea, as well as chest discomfort, that is worse.  According to her but exertion.  However, this also has improved with the better blood pressure control.  She does have some edema to, but less than before.  No PND, or orthopnea.  Extremely oh, before, she mentioned that most of her symptoms of discomfort in her chest associated with palpitations, now she says she still has exertional shortness of breath and discomfort in her chest but is not nearly having as much of the palpitation episodes.  She says now she has several good days and some bad days as far as the episodes, but does have some discomfort, almost every day.  On her monitor, there were some episodes of dizziness, palpitations, and chest discomfort.  There associated sinus tachycardia 110 to 120s.  Despite having the  palpitations, she denies any syncope or near syncope type symptoms and no TIA or amaurosis fugax symptoms.  She does note some mild orthostatic dizziness on occasion, however. No melena, hematochezia, or hematuria.  She has discomfort in her legs and walking, but it does sound like claudication is more musculoskeletal.  Past Medical History  Diagnosis Date  . Hypertension   . Hyperlipidemia   . Thyroid disease   . Stress incontinence     Prior Cardiac Evaluation and Past Surgical History: Past Surgical History  Procedure Laterality Date  . Cholecystectomy    . Doppler echocardiography  March 2011; July 2014    Normal EF, essentially normal; no valvular lesions.  No PFO or ASD  . Treadmill myoview nuclear stress test  March 2011    6 minutes, 7 METS; Diaphragmatic attenuation otherwise no evidence of ischemia or infarction  . Cardionet monitor  July 2014     Sinus rhythm, sinus tachycardia.  Shortness of breath, dizziness, and chest pain noted with sinus tachycardia    Allergies  Allergen Reactions  . Doxycycline Itching and Rash   -- Just started Amlodipine last week.  Has noted improvement in BP.   Current Outpatient Prescriptions  Medication Sig Dispense Refill  . amLODipine (NORVASC) 10 MG tablet Take 0.5 tablets (5 mg total) by mouth daily.  30 tablet  0  . aspirin EC 81 MG tablet Take 81 mg by mouth daily.      . hydrochlorothiazide (MICROZIDE) 12.5 MG capsule Take 12.5 mg by mouth daily.      [name] Kitchen olmesartan (BENICAR) 40  MG tablet Take 40 mg by mouth daily.      . potassium chloride SA (K-DUR,KLOR-CON) 20 MEQ tablet Take 20 mEq by mouth 2 (two) times daily.      . simvastatin (ZOCOR) 20 MG tablet Take 20 mg by mouth at bedtime.      . ALPRAZolam (XANAX) 0.5 MG tablet Take 0.5 mg by mouth 3 (three) times daily as needed for sleep.      . cyclobenzaprine (FLEXERIL) 10 MG tablet Take 10 mg by mouth 3 (three) times daily as needed for muscle spasms.      [name] Kitchen levothyroxine  (SYNTHROID, LEVOTHROID) 25 MCG tablet Take 25 mcg by mouth daily before breakfast.      . Linaclotide 290 MCG CAPS Take 290 mcg by mouth at bedtime.       . naproxen (NAPROSYN) 500 MG tablet Take 500 mg by mouth 2 (two) times daily.       No current facility-administered medications for this visit.    History   Social History  . Marital Status: Divorced    Spouse Name: N/A    Number of Children: 2  . Years of Education: College   Occupational History  . Customer service Other    [name]   Social History Main Topics  . Smoking status: Never Smoker   . Smokeless tobacco: Not on file  . Alcohol Use: No  . Drug Use: No  . Sexually Active: No   Other Topics Concern  . Not on file   Social History Narrative   Remain since her last visit. Her husband and is [name].      Is now working on exercising regularly doing walks for 45 to 60 minutes a day 5 days a week.    ROS: A comprehensive Review of Systems - Negative except Pertinent positives noted above. No GERD-type symptoms. No tissue area or hematuria. No weakness or numbness.    PHYSICAL EXAM BP 110/70  Pulse 72  Ht 5\' 5"  (1.651 m)  Wt 85.594 kg (188 lb 11.2 oz)  BMI 31.4 kg/m2 General appearance: alert, cooperative, no distress and Pleasant mood and affect. Neck: no carotid bruit and no JVD Lungs: clear to auscultation bilaterally, normal percussion bilaterally and Nonlabored, good air movement. Heart: normal apical impulse, regular rate and rhythm, S1, S2 normal, no S3 or S4 and systolic murmur: early systolic 1/6, low pitch and harsh at lower left sternal border Abdomen: soft, non-tender; bowel sounds normal; no masses,  no organomegaly Extremities: extremities normal, atraumatic, no cyanosis or edema, no edema, redness or tenderness in the calves or thighs and no ulcers, gangrene or trophic changes Pulses: 2+ and symmetric Neurologic: A&O X 3, normal strength and tone. Normal symmetric reflexes. Normal  coordination and gait  [id number] today: No  ASSESSMENT: 47 year old woman with cardiac risk factors, previously normal cardiac evaluation.  A couple years ago.  So far her evaluation has been pretty normal, but she is not satisfied with out doing a stress test to evaluate her exertional chest discomfort.   I seen recall her last visit that seemed at symptoms are somewhat different than they're currently been described as.  Chest discomfort at rest improved with exertion Interestingly, now she notes the discomfort worsens with exertion.   Her symptoms are really not fully consistent with angina, however, she does not seem to be reassured by that concept.  At this point think in order to answer her question fully and completely, I think I would prefer  to use A Myoview Stress Test as opposed to a simple treadmill stress test for better sensitivity and specificity.  Murmur Probably a flow murmur since her echocardiogram did not really show any notable valvular abnormalities.  Palpitations Requesting relatively well controlled with blood pressure control. In fact, now, wouldn't have blood pressure room to use a beta blocker.  She states to be very sensitive to stress.  Plan tachycardia.  It does not appear, however, that she has inappropriate tachycardia.  HYPERTENSION Her blood pressure was much better now than it had been.  She is on the HCTZ to help with her edema and get better, but she now has symptoms of orthostasis.  We may even need to back off on some of her antihypertensive control, however, I think will soon be sure that she stays adequately hydrated and maintained potassium level as this would make her more susceptible to tachyarrhythmias.   PLAN: Per problem list. Orders Placed This Encounter  Procedures  . Myocardial Perfusion Imaging    Standing Status: Future     Number of Occurrences:      Standing Expiration Date: 03/04/2014    Scheduling Instructions:     Need to hold  Bystolic the night before and the morning of myoview. She can take the medication later that day    Order Specific Question:  Where should this test be performed    Answer:  [facility]    Order Specific Question:  Type of stress    Answer:  Exercise    Followup: 3 to 4 weeks  Roylee Chaffin W, M.D., M.S. [facility] [address] [place], [place]  [address]  [phone] Pager # [phone] 5:15 PM

## 2013-03-09 NOTE — Assessment & Plan Note (Signed)
Her blood pressure was much better now than it had been.  She is on the HCTZ to help with her edema and get better, but she now has symptoms of orthostasis.  We may even need to back off on some of her antihypertensive control, however, I think will soon be sure that she stays adequately hydrated and maintained potassium level as this would make her more susceptible to tachyarrhythmias.

## 2013-03-10 ENCOUNTER — Telehealth (HOSPITAL_COMMUNITY): Payer: Self-pay | Admitting: Cardiology

## 2013-03-20 ENCOUNTER — Ambulatory Visit (HOSPITAL_COMMUNITY)
Admission: RE | Admit: 2013-03-20 | Discharge: 2013-03-20 | Disposition: A | Discharge status: Home / Self Care | Payer: BC Managed Care – PPO | Source: Ambulatory Visit | Attending: Cardiovascular Disease | Admitting: Cardiovascular Disease

## 2013-03-20 DIAGNOSIS — R42 Dizziness and giddiness: Secondary | ICD-10-CM | POA: Insufficient documentation

## 2013-03-20 DIAGNOSIS — E663 Overweight: Secondary | ICD-10-CM | POA: Insufficient documentation

## 2013-03-20 DIAGNOSIS — R079 Chest pain, unspecified: Secondary | ICD-10-CM

## 2013-03-20 DIAGNOSIS — R000 Tachycardia, unspecified: Secondary | ICD-10-CM | POA: Insufficient documentation

## 2013-03-20 DIAGNOSIS — I10 Essential (primary) hypertension: Secondary | ICD-10-CM | POA: Insufficient documentation

## 2013-03-20 DIAGNOSIS — R0789 Other chest pain: Secondary | ICD-10-CM

## 2013-03-20 DIAGNOSIS — R002 Palpitations: Secondary | ICD-10-CM

## 2013-03-20 MED ORDER — TECHNETIUM TC 99M SESTAMIBI GENERIC - CARDIOLITE
10.3000 | Freq: Once | INTRAVENOUS | Status: AC | PRN
  Administered 2013-03-20: 10 via INTRAVENOUS

## 2013-03-20 MED ORDER — TECHNETIUM TC 99M SESTAMIBI GENERIC - CARDIOLITE
30.2000 | Freq: Once | INTRAVENOUS | Status: AC | PRN
  Administered 2013-03-20: 30.2 via INTRAVENOUS

## 2013-03-20 NOTE — Procedures (Addendum)
[facility] [address] [address] [place] [address] [place] [address] [phone]  Cardiology Nuclear Med Study  [name] is a 47 y.o. female     MRN : [id number]     DOB: [birth year]  Procedure Date: 03/20/2013  Nuclear Med Background Indication for Stress Test:  Evaluation for Ischemia History:  NO PRIOR HISTORY REPORTED Cardiac Risk Factors: Hypertension, Lipids and Overweight  Symptoms:  Chest Pain, Dizziness, DOE, Fatigue, Light-Headedness and TACHYCARDIA   Nuclear Pre-Procedure Caffeine/Decaff Intake:  1:00am NPO After: 11AM   IV Site: R Antecubital  IV 0.9% NS with Angio Cath:  22g  Chest Size (in):  N/A IV Started by: Emmit Pomfret, RN  Height: 5\' 5"  (1.651 m)  Cup Size: DD  BMI:  Body mass index is 31.28 kg/(m^2). Weight:  188 lb (85.276 kg)   Tech Comments:  N/A    Nuclear Med Study 1 or 2 day study: 1 day  Stress Test Type:  Stress  Order Authorizing Provider:  Bryan Lemma, MD   Resting Radionuclide: Technetium 30m Sestamibi  Resting Radionuclide Dose: 10.3 mCi   Stress Radionuclide:  Technetium 25m Sestamibi  Stress Radionuclide Dose: 30.2 mCi           Stress Protocol Rest HR: 74 Stress HR: 166  Rest BP: 130/80 Stress BP: 132/91  Exercise Time (min): 9 METS: 10.1   Predicted Max HR: 171 bpm % Max HR: 97.08 bpm Rate Pressure Product: 21308  Dose of Adenosine (mg):  n/a Dose of Lexiscan: n/a mg  Dose of Atropine (mg): n/a Dose of Dobutamine: n/a mcg/kg/min (at max HR)  Stress Test Technologist: Esperanza Sheets, CCT Nuclear Technologist: Gonzella Lex, CNMT   Rest Procedure:  Myocardial perfusion imaging was performed at rest 45 minutes following the intravenous administration of Technetium 69m Sestamibi. Stress Procedure:  The patient performed treadmill exercise using a Bruce  Protocol for 9 minutes. The patient stopped due to target heart rate was achieved and denied any chest pain.  There were no  significant ST-T wave changes.  Technetium 55m Sestamibi was injected at peak exercise and myocardial perfusion imaging was performed after a brief delay.  Transient Ischemic Dilatation (Normal <1.22):  0.78 Lung/Heart Ratio (Normal <0.45):  0.37 QGS EDV:  33 ml QGS ESV:  11 ml LV Ejection Fraction: 66%  Rest ECG: NSR - Normal EKG  Stress ECG: No significant ST segment change suggestive of ischemia.  QPS Raw Data Images:  Normal; no motion artifact; normal heart/lung ratio. Stress Images:  Normal homogeneous uptake in all areas of the myocardium. Rest Images:  Normal homogeneous uptake in all areas of the myocardium. Subtraction (SDS):  No evidence of ischemia.  Impression Exercise Capacity:  Good exercise capacity. BP Response:  Normal blood pressure response. Clinical Symptoms:  No significant symptoms noted. ECG Impression:  There are scattered PVCs. Comparison with Prior Nuclear Study: No previous nuclear study performed  Overall Impression:  Normal stress nuclear study.  LV Wall Motion:  NL LV Function; NL Wall Motion; EF 66%  Chrystie Nose, MD, [facility] Board Certified in Nuclear Cardiology Attending Cardiologist [facility]  Chrystie Nose, MD  03/20/2013 4:44 PM

## 2013-03-21 ENCOUNTER — Telehealth: Payer: Self-pay | Admitting: *Deleted

## 2013-03-21 NOTE — Telephone Encounter (Signed)
Message copied by Tobin Chad on Fri Mar 21, 2013  5:24 PM ------      Message from: [facility], DAVID      Created: Fri Mar 21, 2013  9:24 AM       Pls call pt.            Marykay Lex, MD       ------

## 2013-03-21 NOTE — Telephone Encounter (Signed)
Left message on voicemail results are good, an appt will be made.

## 2013-04-04 ENCOUNTER — Telehealth: Payer: Self-pay | Admitting: Cardiology

## 2013-04-04 NOTE — Telephone Encounter (Signed)
Pt needs to make a fu appointment to go over test results

## 2013-04-04 NOTE — Telephone Encounter (Signed)
Message copied by Almira Coaster on Fri Apr 04, 2013  8:29 AM ------      Message from: Tobin Chad      Created: Thu Apr 03, 2013  5:06 PM                   ----- Message -----         From: Tobin Chad, RN         Sent: 03/21/2013   5:20 PM           To: Tobin Chad, RN, [facility] Admin Pool            Pt needs follow up appt -results of myoview.       One was not made at office visit discharge. ------

## 2013-04-08 ENCOUNTER — Ambulatory Visit: Payer: BC Managed Care – PPO | Admitting: Cardiology

## 2013-07-13 ENCOUNTER — Emergency Department (HOSPITAL_COMMUNITY)
Admission: EM | Admit: 2013-07-13 | Discharge: 2013-07-14 | Disposition: A | Discharge status: Home / Self Care | Payer: BC Managed Care – PPO | Attending: Emergency Medicine | Admitting: Emergency Medicine

## 2013-07-13 DIAGNOSIS — Z3202 Encounter for pregnancy test, result negative: Secondary | ICD-10-CM | POA: Insufficient documentation

## 2013-07-13 DIAGNOSIS — I10 Essential (primary) hypertension: Secondary | ICD-10-CM | POA: Insufficient documentation

## 2013-07-13 DIAGNOSIS — R1110 Vomiting, unspecified: Secondary | ICD-10-CM | POA: Insufficient documentation

## 2013-07-13 DIAGNOSIS — R109 Unspecified abdominal pain: Secondary | ICD-10-CM

## 2013-07-13 DIAGNOSIS — E876 Hypokalemia: Secondary | ICD-10-CM

## 2013-07-13 DIAGNOSIS — E785 Hyperlipidemia, unspecified: Secondary | ICD-10-CM | POA: Insufficient documentation

## 2013-07-13 DIAGNOSIS — R1031 Right lower quadrant pain: Secondary | ICD-10-CM | POA: Insufficient documentation

## 2013-07-13 DIAGNOSIS — G8929 Other chronic pain: Secondary | ICD-10-CM | POA: Insufficient documentation

## 2013-07-14 ENCOUNTER — Encounter (HOSPITAL_COMMUNITY): Payer: Self-pay | Admitting: Emergency Medicine

## 2013-07-14 ENCOUNTER — Emergency Department (HOSPITAL_COMMUNITY): Payer: BC Managed Care – PPO

## 2013-07-14 LAB — URINALYSIS, ROUTINE W REFLEX MICROSCOPIC
Bilirubin Urine: NEGATIVE
Glucose, UA: NEGATIVE mg/dL
Hgb urine dipstick: NEGATIVE
Ketones, ur: NEGATIVE mg/dL
Leukocytes, UA: NEGATIVE
Nitrite: NEGATIVE
Protein, ur: 30 mg/dL — AB
Specific Gravity, Urine: 1.03 (ref 1.005–1.030)
Urobilinogen, UA: 1 mg/dL (ref 0.0–1.0)
pH: 6 (ref 5.0–8.0)

## 2013-07-14 LAB — COMPREHENSIVE METABOLIC PANEL
Albumin: 3.9 g/dL (ref 3.5–5.2)
BUN: 15 mg/dL (ref 6–23)
Calcium: 9.2 mg/dL (ref 8.4–10.5)
GFR calc Af Amer: 86 mL/min — ABNORMAL LOW (ref >90)
Glucose, Bld: 117 mg/dL — ABNORMAL HIGH (ref 70–99)
Sodium: 140 mEq/L (ref 135–145)
Total Protein: 7.2 g/dL (ref 6.0–8.3)

## 2013-07-14 LAB — CBC WITH DIFFERENTIAL/PLATELET
Basophils Absolute: 0 K/uL (ref 0.0–0.1)
Basophils Relative: 0 % (ref 0–1)
Eosinophils Absolute: 0.4 10*3/uL (ref 0.0–0.7)
Eosinophils Relative: 3 % (ref 0–5)
HCT: 33.6 % — ABNORMAL LOW (ref 36.0–46.0)
Hemoglobin: 11.5 g/dL — ABNORMAL LOW (ref 12.0–15.0)
Lymphocytes Relative: 25 % (ref 12–46)
Lymphs Abs: 2.8 10*3/uL (ref 0.7–4.0)
MCH: 32.1 pg (ref 26.0–34.0)
MCHC: 34.2 g/dL (ref 30.0–36.0)
MCV: 93.9 fL (ref 78.0–100.0)
Monocytes Absolute: 0.7 K/uL (ref 0.1–1.0)
Monocytes Relative: 7 % (ref 3–12)
Neutro Abs: 7.4 K/uL (ref 1.7–7.7)
Neutrophils Relative %: 65 % (ref 43–77)
Platelets: 292 10*3/uL (ref 150–400)
RBC: 3.58 MIL/uL — ABNORMAL LOW (ref 3.87–5.11)
RDW: 13.8 % (ref 11.5–15.5)
WBC: 11.4 K/uL — ABNORMAL HIGH (ref 4.0–10.5)

## 2013-07-14 LAB — URINE MICROSCOPIC-ADD ON

## 2013-07-14 LAB — GC/CHLAMYDIA PROBE AMP: GC Probe RNA: NEGATIVE

## 2013-07-14 LAB — COMPREHENSIVE METABOLIC PANEL WITH GFR
ALT: 27 U/L (ref 0–35)
AST: 25 U/L (ref 0–37)
Alkaline Phosphatase: 65 U/L (ref 39–117)
CO2: 26 meq/L (ref 19–32)
Chloride: 105 meq/L (ref 96–112)
Creatinine, Ser: 0.9 mg/dL (ref 0.50–1.10)
GFR calc non Af Amer: 74 mL/min — ABNORMAL LOW (ref >90)
Potassium: 3 meq/L — ABNORMAL LOW (ref 3.5–5.1)
Total Bilirubin: 0.4 mg/dL (ref 0.3–1.2)

## 2013-07-14 LAB — POCT PREGNANCY, URINE: Preg Test, Ur: NEGATIVE

## 2013-07-14 LAB — WET PREP, GENITAL
Clue Cells Wet Prep HPF POC: NONE SEEN
Trich, Wet Prep: NONE SEEN
Yeast Wet Prep HPF POC: NONE SEEN

## 2013-07-14 LAB — LIPASE, BLOOD: Lipase: 24 U/L (ref 11–59)

## 2013-07-14 MED ORDER — POTASSIUM CHLORIDE CRYS ER 20 MEQ PO TBCR
40.0000 meq | EXTENDED_RELEASE_TABLET | Freq: Once | ORAL | Status: AC
  Administered 2013-07-14: 40 meq via ORAL
  Filled 2013-07-14: qty 2

## 2013-07-14 MED ORDER — IOHEXOL 300 MG/ML  SOLN
50.0000 mL | Freq: Once | INTRAMUSCULAR | Status: AC | PRN
  Administered 2013-07-14: 50 mL via ORAL

## 2013-07-14 MED ORDER — HYDROCODONE-ACETAMINOPHEN 5-325 MG PO TABS: 1.0000 | ORAL_TABLET | ORAL | Status: DC | PRN

## 2013-07-14 MED ORDER — ONDANSETRON HCL 8 MG PO TABS: 8.0000 mg | ORAL_TABLET | Freq: Three times a day (TID) | ORAL | Status: DC | PRN

## 2013-07-14 MED ORDER — ONDANSETRON HCL 4 MG/2ML IJ SOLN
4.0000 mg | Freq: Once | INTRAMUSCULAR | Status: AC
  Administered 2013-07-14: 4 mg via INTRAVENOUS
  Filled 2013-07-14: qty 2

## 2013-07-14 MED ORDER — ONDANSETRON 8 MG PO TBDP
8.0000 mg | ORAL_TABLET | Freq: Once | ORAL | Status: AC
  Administered 2013-07-14: 8 mg via ORAL
  Filled 2013-07-14: qty 1

## 2013-07-14 MED ORDER — IOHEXOL 300 MG/ML  SOLN
100.0000 mL | Freq: Once | INTRAMUSCULAR | Status: AC | PRN
  Administered 2013-07-14: 100 mL via INTRAVENOUS

## 2013-07-14 NOTE — ED Provider Notes (Signed)
Medical screening examination/treatment/procedure(s) were performed by non-physician practitioner and as supervising physician I was immediately available for consultation/collaboration.  EKG Interpretation   None         Gavin Pound. Oletta Lamas, MD 07/14/13 [phone]

## 2013-07-14 NOTE — ED Provider Notes (Signed)
CSN: [id number]     Arrival date & time 07/13/13  2343 History   First MD Initiated Contact with Patient 07/14/13 0019     Chief Complaint  Patient presents with  . Abdominal Pain  . Emesis   (Consider location/radiation/quality/duration/timing/severity/associated sxs/prior Treatment) HPI History provided by pt.  Pt felt nml before going to bed but woke at 11pm w/ severe, sharp pain in RLQ.  Pain improved after ~10 minutes but then increased in intensity again.  It is currently dull, aching and non-radiating.  Associated w/ 3 episodes of vomiting.  Denies fever, anorexia, change in bowels, urinary and vaginal sx.  Pt has had pain in this same location intermittently for the past year.  Was evaluated by her gynecologist one week ago and had a biopsy of endometrium at that time.  Results pending. The pain has never been this sharp or severe.  Past abd surgeries include cholecystectomy, laparoscopy for endometriosis and removal of ectopic pregnancy.  Per prior chart, pt had a CT abd/pelvis in 2011 for evaluation of chronic RLQ pain and it was unremarkable.  Had a f/u ileocolonoscopy that revealed no explanation for pain and it was assumed to be most likely d/t endometriosis.  Past Medical History  Diagnosis Date  . Hypertension   . Hyperlipidemia   . Thyroid disease   . Stress incontinence    Past Surgical History  Procedure Laterality Date  . Cholecystectomy    . Doppler echocardiography  March 2011; July 2014    Normal EF, essentially normal; no valvular lesions.  No PFO or ASD  . Treadmill myoview nuclear stress test  March 2011    6 minutes, 7 METS; Diaphragmatic attenuation otherwise no evidence of ischemia or infarction  . Cardionet monitor  July 2014     Sinus rhythm, sinus tachycardia.  Shortness of breath, dizziness, and chest pain noted with sinus tachycardia   Family History  Problem Relation Age of Onset  . Hypertension Mother   . Hypertension Father    History  Substance  Use Topics  . Smoking status: Never Smoker   . Smokeless tobacco: Not on file  . Alcohol Use: No   OB History   Grav Para Term Preterm Abortions TAB SAB Ect Mult Living                 Review of Systems  All other systems reviewed and are negative.    Allergies  Doxycycline  Home Medications   Current Outpatient Rx  Name  Route  Sig  Dispense  Refill  . ALPRAZolam (XANAX) 0.5 MG tablet   Oral   Take 0.5 mg by mouth 3 (three) times daily as needed for anxiety or sleep.          [name] Kitchen amLODipine (NORVASC) 10 MG tablet   Oral   Take 5 mg by mouth every evening.         . cyclobenzaprine (FLEXERIL) 10 MG tablet   Oral   Take 10 mg by mouth 3 (three) times daily as needed for muscle spasms.         . hydrochlorothiazide (MICROZIDE) 12.5 MG capsule   Oral   Take 12.5 mg by mouth every morning.          . Linaclotide 290 MCG CAPS   Oral   Take 290 mcg by mouth at bedtime.          [name] Kitchen olmesartan (BENICAR) 40 MG tablet   Oral   Take 40 mg  by mouth every morning.          . simvastatin (ZOCOR) 20 MG tablet   Oral   Take 20 mg by mouth at bedtime.          BP 136/95  Pulse 100  Temp(Src) 97.9 F (36.6 C) (Oral)  Resp 20  SpO2 100%  LMP 06/25/2013 Physical Exam  Nursing note and vitals reviewed. Constitutional: She is oriented to person, place, and time. She appears well-developed and well-nourished. No distress.  HENT:  Head: Normocephalic and atraumatic.  Mouth/Throat: Oropharynx is clear and moist.  Eyes:  Normal appearance  Neck: Normal range of motion.  Cardiovascular: Normal rate and regular rhythm.   Pulmonary/Chest: Effort normal and breath sounds normal. No respiratory distress.  Abdominal: Soft. Bowel sounds are normal. She exhibits no distension and no mass. There is no rebound and no guarding.  Obese.  RLQ and right suprapubic ttp.   Genitourinary:  No CVA tenderness.  Nml external genitalia.  No vaginal discharge/bleeding.  Cervix  closed and appears nml.  No cervical motion tenderness.  Bilateral, mild adnexal tenderness, though pt reports right-sided tenderness does not reproduce the pain she had experienced prior to arrival.  Musculoskeletal: Normal range of motion.  Neurological: She is alert and oriented to person, place, and time.  Skin: Skin is warm and dry. No rash noted.  Psychiatric: She has a normal mood and affect. Her behavior is normal.    ED Course  Procedures (including critical care time) Labs Review Labs Reviewed  WET PREP, GENITAL - Abnormal; Notable for the following:    WBC, Wet Prep HPF POC FEW (*)    All other components within normal limits  CBC WITH DIFFERENTIAL - Abnormal; Notable for the following:    WBC 11.4 (*)    RBC 3.58 (*)    Hemoglobin 11.5 (*)    HCT 33.6 (*)    All other components within normal limits  COMPREHENSIVE METABOLIC PANEL - Abnormal; Notable for the following:    Potassium 3.0 (*)    Glucose, Bld 117 (*)    GFR calc non Af Amer 74 (*)    GFR calc Af Amer 86 (*)    All other components within normal limits  URINALYSIS, ROUTINE W REFLEX MICROSCOPIC - Abnormal; Notable for the following:    Color, Urine AMBER (*)    Protein, ur 30 (*)    All other components within normal limits  URINE MICROSCOPIC-ADD ON - Abnormal; Notable for the following:    Squamous Epithelial / LPF FEW (*)    All other components within normal limits  GC/CHLAMYDIA PROBE AMP  LIPASE, BLOOD  POCT PREGNANCY, URINE   Imaging Review Ct Abdomen Pelvis W Contrast  07/14/2013   CLINICAL DATA:  Exploratory laparotomy for endometriosis. Right lower quadrant pain.  EXAM: CT ABDOMEN AND PELVIS WITH CONTRAST  TECHNIQUE: Multidetector CT imaging of the abdomen and pelvis was performed using the standard protocol following bolus administration of intravenous contrast.  CONTRAST:  OMNIPAQUE IOHEXOL 300 MG/ML  SOLN  COMPARISON:  CT of the abdomen and pelvis February 17, 2010  FINDINGS: Included view of  the lung bases are clear. Visualized heart and pericardium are unremarkable.  The liver, spleen, pancreas, adrenal glands are unremarkable. Status post cholecystectomy.  Stomach, small and large bowel are normal in course and caliber, contrast has yet to reach the distal small bowel.  Kidneys are unremarkable, unchanged. Delayed imaging demonstrates symmetric prompt excretion of contrast into the  proximal urinary collecting system. The ureters are normal in course and caliber, no urolithiasis. Urinary bladder is well distended, unremarkable.  No intraperitoneal free fluid nor free air. A 2.1 x 1.4 cm crenulated peripherally enhancing right adnexal presumed involuting corpus luteal cyst. Internal reproductive organs are nonsuspicious. Great vessels are normal in course and caliber. No lymphadenopathy by CT size criteria. Moderate T10-11 degenerative disc disease.  IMPRESSION: Right adnexal presumed involuting corpus luteal cyst.  Status post cholecystectomy.   Electronically Signed   By: Awilda Metro   On: 07/14/2013 03:02    EKG Interpretation   None       MDM   1. Abdominal pain   2. Hypokalemia    47yo F w/ chronic, intermittent RLQ pain, presents w/ pain in similar location but of increased severity, that woke her from sleep at 11pm last night.  PMH sig for endometriosis and possibly IBS (Is reportedly on Linzess for chronic constipation).   Evaluated for RLQ pain in 2011- CT abd and colonoscopy did not reveal etiology of pain.  Saw gynecologist last week for this pain as well, had an endometrial biopsy and results pending.  F/u scheduled for this week.  On exam today, afebrile, non-toxic and comfortable appearing, abd soft/non-distended, RLQ and R suprapubic ttp, unremarkable genitalia.  Labs sig for hypokalemia.  Pt reports that this is a chronic problem but she is non-compliant w/ supplement b/c it causes her to vomit.  Replaced w/ 40mg  po in ED and advised her to take at home.  She will be  prescribed zofran.  CT abd/pelvis non-acute; no explanation for sx.  On re-evaluation, patient's pain has resolved and she has not had any further vomiting.  Tolerating pos.  Abdominal exam stable.  Advised f/u with her gynecologist as scheduled and return for worsening pain, uncontrolled vomiting or associated fever.  Prescribed 8 vicodin. 3:45 AM    Otilio Miu, PA-C 07/14/13 0345

## 2013-07-14 NOTE — ED Notes (Signed)
Pt arrived to the Ed with a complaint of right lower abdominal pain.  Pt also has had emesis episodes twicwe since 2300 hrs yesterday.

## 2013-12-08 ENCOUNTER — Telehealth: Payer: Self-pay | Admitting: Gastroenterology

## 2013-12-08 NOTE — Telephone Encounter (Signed)
Patient came by the office today wanting to make an OV with SF. [facility]. She said she could come on 5/18 or 5/27 (days that she's off work). I told her that I do not have anything available on either of those days. I offered her something on May 28th, but she couldn't come that day. She is anxious to be seen and is wanting us to work around her work schedule. She said that her PCP told her that she needed to come here to be seen, but I do not have a referral from [facility]. [facility], labs, XR, etc so we would know more of patient's condition. I told patient that once I received the referral the nurse and /or SF would review it and make recommendations when we could get her OV made. Please advise if we can manage to get patient in with SF on 5/27th. [phone]

## 2013-12-15 NOTE — Telephone Encounter (Signed)
Pt returned call. She has been having abdominal pain for several years. It is LLq and RLQ but sorse on the right side at times.  The pain is intermittent for the most part, but there is always a dull feeling that she is aware of.  She also has constipation, takes Linzess 290 mcg qd but still has to take a laxative to have a BM every 7-10 days.   Her hemoglobin was 11.5 on 12/08/2013.   She has a difficult time scheduling due to her work schedule. Is willing to see extender in the office.  Is scheduled for OV with Emily HallsAnna Sams, NP on 01/15/2014 at 9:30 AM.  Would like to be on cancellation list if you get cancellations a couple of days in advance.   Also, would like to know if Dr. Darrick PennaFields would be willing to work with her on her schedule and see her sooner.   Please advise!

## 2013-12-15 NOTE — Telephone Encounter (Signed)
PUT PT ON FOR [facility] MAY 27 AT 1115.

## 2013-12-15 NOTE — Telephone Encounter (Signed)
Pt is aware of OV with SF on 5/27 at 1115. She agreed to cancel the OV for 6/4 since she will be seeing SF on 5/27.

## 2013-12-15 NOTE — Telephone Encounter (Signed)
I reviewed info [facility]. I did not see anything required an urgent office visit.   Her hemoglobin was 11.5 on 12/08/2013.   I called pt and LMOM for a return call to discuss any issues and to get her an appt.

## 2013-12-16 ENCOUNTER — Encounter: Payer: Self-pay | Admitting: Cardiovascular Disease

## 2013-12-16 ENCOUNTER — Ambulatory Visit (INDEPENDENT_AMBULATORY_CARE_PROVIDER_SITE_OTHER): Payer: BC Managed Care – PPO | Admitting: Cardiovascular Disease

## 2013-12-16 ENCOUNTER — Encounter (HOSPITAL_COMMUNITY): Payer: Self-pay | Admitting: Cardiology

## 2013-12-16 VITALS — BP 138/86 | HR 92 | Ht 66.0 in | Wt 199.0 lb

## 2013-12-16 DIAGNOSIS — I10 Essential (primary) hypertension: Secondary | ICD-10-CM

## 2013-12-16 DIAGNOSIS — E785 Hyperlipidemia, unspecified: Secondary | ICD-10-CM | POA: Insufficient documentation

## 2013-12-16 DIAGNOSIS — R002 Palpitations: Secondary | ICD-10-CM

## 2013-12-16 DIAGNOSIS — R0789 Other chest pain: Secondary | ICD-10-CM

## 2013-12-16 MED ORDER — METOPROLOL SUCCINATE ER 25 MG PO TB24: 25.0000 mg | ORAL_TABLET | Freq: Every day | ORAL | Status: DC

## 2013-12-16 NOTE — Progress Notes (Signed)
12/16/2013 [name]   01/05/1966  [id number]  Primary Physician Colette RibasGOLDING, JOHN CABOT, MD Primary Cardiologist: Runell GessJonathan J. Belen Pesch MD Roseanne RenoFACP,FACC,FAHA, FSCAI   HPI:  [name] is a very pleasant 48 year old moderately overweight married [place] female mother of 2 children he works at [employer].she is a patient of Dr. Onalee Huaavid Harding's this am seeing her today as an add-on. She has had an extensive workup in the past including a unremarkable event monitor which only showed sinus tachycardia, normal 2-D echocardiogram and normal Myoview stress test. She says that her palpitations are more noticeable recently. They have awakened her from sleep. There is occasional atypical chest pain associated with them. She recently had blood work obtained by her PCP, Dr. Phillips OdorGolding.   Current Outpatient Prescriptions  Medication Sig Dispense Refill  . ALPRAZolam (XANAX) 0.5 MG tablet Take 0.5 mg by mouth 3 (three) times daily as needed for anxiety or sleep.       [name] Kitchen. amLODipine (NORVASC) 10 MG tablet Take 5 mg by mouth every evening.      [name] Kitchen. atorvastatin (LIPITOR) 20 MG tablet Take 20 mg by mouth daily at 6 PM.       . cyclobenzaprine (FLEXERIL) 10 MG tablet Take 10 mg by mouth 3 (three) times daily as needed for muscle spasms.      . diphenhydrAMINE (BENADRYL) 25 MG tablet Take 25 mg by mouth every 6 (six) hours as needed.      . hydrochlorothiazide (HYDRODIURIL) 25 MG tablet Take 25 mg by mouth daily.      [name] Kitchen. KLOR-CON M20 20 MEQ tablet Take 20 mEq by mouth daily.       . Linaclotide 290 MCG CAPS Take 290 mcg by mouth at bedtime.       . naproxen (NAPROSYN) 500 MG tablet Take 500 mg by mouth as needed.      [name] Kitchen. olmesartan (BENICAR) 40 MG tablet Take 40 mg by mouth every morning.       . metoprolol succinate (TOPROL XL) 25 MG 24 hr tablet Take 1 tablet (25 mg total) by mouth daily.  30 tablet  3   No current facility-administered medications for this visit.    Allergies  Allergen  Reactions  . Doxycycline Itching and Rash    History   Social History  . Marital Status: Divorced    Spouse Name: N/A    Number of Children: 2  . Years of Education: College   Occupational History  . Customer service Other    [employer]   Social History Main Topics  . Smoking status: Never Smoker   . Smokeless tobacco: Not on file  . Alcohol Use: No  . Drug Use: No  . Sexual Activity: No   Other Topics Concern  . Not on file   Social History Narrative   Remain since her last visit. Her husband and is Casimiro NeedleMichael.      Is now working on exercising regularly doing walks for 45 to 60 minutes a day 5 days a week.     Review of Systems: General: negative for chills, fever, night sweats or weight changes.  Cardiovascular: negative for chest pain, dyspnea on exertion, edema, orthopnea, palpitations, paroxysmal nocturnal dyspnea or shortness of breath Dermatological: negative for rash Respiratory: negative for cough or wheezing Urologic: negative for hematuria Abdominal: negative for nausea, vomiting, diarrhea, bright red blood per rectum, melena, or hematemesis Neurologic: negative for visual changes, syncope, or dizziness All other systems reviewed and  are otherwise negative except as noted above.    Blood pressure 138/86, pulse 92, height 5\' 6"  (1.676 m), weight 199 lb (90.266 kg).  General appearance: alert and no distress Neck: no adenopathy, no carotid bruit, no JVD, supple, symmetrical, trachea midline and thyroid not enlarged, symmetric, no tenderness/mass/nodules Lungs: clear to auscultation bilaterally Heart: regular rate and rhythm, S1, S2 normal, no murmur, click, rub or gallop Extremities: extremities normal, atraumatic, no cyanosis or edema  EKG normal sinus rhythm at 92 without ST or T wave changes  ASSESSMENT AND PLAN:   HYPERTENSION Well-controlled on current medications  Hyperlipidemia On statin therapy followed by her PCP  Palpitations Patient has  been followed by Dr. Bryan Lemmaavid Harding for palpitations. She's had a unremarkable event monitor in the past as well as normal 2-D echo and negative Myoview stress test. She says more recently she's had more noticeable palpitations. These have awakened her at night she has some atypical chest pain associated with them. Dr. Phillips OdorGolding, her PCP, recently obtained routine blood work. I am going to begin low-dose beta-blockade and will have her see Dr. Herbie BaltimoreHarding back in 3 months      Runell GessJonathan J. Keela Rubert MD [facility],FACC,FAHA, [facility] 12/16/2013 9:16 AM

## 2013-12-16 NOTE — Assessment & Plan Note (Signed)
Well-controlled on current medications 

## 2013-12-16 NOTE — Patient Instructions (Signed)
Your physician wants you to follow-up in: 3 months with Dr Herbie BaltimoreHarding.  You will receive a reminder letter in the mail two months in advance. If you don't receive a letter, please call our office to schedule the follow-up appointment.   START Toprol 25mg  daily.

## 2013-12-16 NOTE — Assessment & Plan Note (Signed)
Patient has been followed by Dr. Bryan Lemmaavid Harding for palpitations. She's had a unremarkable event monitor in the past as well as normal 2-D echo and negative Myoview stress test. She says more recently she's had more noticeable palpitations. These have awakened her at night she has some atypical chest pain associated with them. Dr. Phillips OdorGolding, her PCP, recently obtained routine blood work. I am going to begin low-dose beta-blockade and will have her see Dr. Herbie BaltimoreHarding back in 3 months

## 2013-12-16 NOTE — Assessment & Plan Note (Signed)
On statin therapy followed by her PCP 

## 2014-01-07 ENCOUNTER — Other Ambulatory Visit: Payer: Self-pay | Admitting: Gastroenterology

## 2014-01-07 ENCOUNTER — Encounter: Payer: Self-pay | Admitting: Gastroenterology

## 2014-01-07 ENCOUNTER — Ambulatory Visit (INDEPENDENT_AMBULATORY_CARE_PROVIDER_SITE_OTHER): Payer: BC Managed Care – PPO | Admitting: Gastroenterology

## 2014-01-07 VITALS — BP 116/77 | HR 73 | Temp 97.4°F | Ht 66.0 in | Wt 201.4 lb

## 2014-01-07 DIAGNOSIS — R109 Unspecified abdominal pain: Secondary | ICD-10-CM

## 2014-01-07 DIAGNOSIS — R194 Change in bowel habit: Secondary | ICD-10-CM

## 2014-01-07 NOTE — Progress Notes (Signed)
Reminder in epic °

## 2014-01-07 NOTE — Progress Notes (Signed)
Subjective:    Patient ID: [name], female    DOB: Jan 16, 1966, 48 y.o.   MRN: [id number]  Colette Ribas, MD  HPI Pain in rlq/r flank pain. Sx for at least she saw me last. 6 mos ago woke her up in the middle of the night and she went to the ED.  NO TRIGGERS. PAIN DAILY-MORE INTENSE THAN THAN OTHERS. BAD DAYS: EVER DAY. CAN GO AWAY. WHEN IT HITS SHE CAN'T WALK OR STAND UP.  STILL HAVING A MENSTRUAL CYCLE. FELT WORSE AROUND CYCLE, BUT GYN COULDN'T FIND ANYTHING WRONG. SHARP PAIN MAY LAST 5 MINS OR HRS. THINKS SHE HAD A EX LAP. MAY HAVE NAUSEA/VOMITING WITH SEVERE PAIN. LOST 25 LBS-WANTED TO LOSE WEIGHT. GOT MARRIED.  BMs: MAY BE STICKY BUT NOT BLACK. BMs: EVERY 2 WEEKS. NOW ON LINZESS WITH LAXATIVE BM EVERY 7-10 DAYS. NO STRESS. STOPPED WALKING BECAUSE HER HEAR RATE INCREASED AND SHE WAS SCARED. PT DENIES FEVER, CHILLS, BRBPR, melena, diarrhea, problems swallowing, problems with sedation, CHEST PAIN, SOB, OR heartburn or indigestion.  Past Medical History  Diagnosis Date  . Hypertension   . Hyperlipidemia   . Thyroid disease   . Stress incontinence   . Palpitations   . Obesity    Past Surgical History  Procedure Laterality Date  . Cholecystectomy    . Doppler echocardiography  March 2011; July 2014    Normal EF, essentially normal; no valvular lesions.  No PFO or ASD  . Treadmill myoview nuclear stress test  March 2011    6 minutes, 7 METS; Diaphragmatic attenuation otherwise no evidence of ischemia or infarction  . Cardionet monitor  July 2014     Sinus rhythm, sinus tachycardia.  Shortness of breath, dizziness, and chest pain noted with sinus tachycardia   Allergies  Allergen Reactions  . Doxycycline Itching and Rash    Current Outpatient Prescriptions  Medication Sig Dispense Refill  . ALPRAZolam (XANAX) 0.5 MG tablet Take 0.5 mg by mouth 3 (three) times daily as needed for anxiety or sleep.   NONE IN SINCE LAST YEAR FOR BAD HAs    . amLODipine (NORVASC) 10  MG tablet Take 5 mg by mouth every evening.      [name] Kitchen atorvastatin (LIPITOR) 20 MG tablet Take 20 mg by mouth daily at 6 PM.       . diphenhydrAMINE (BENADRYL) 25 MG tablet Take 25 mg by mouth every 6 (six) hours as needed.      . hydrochlorothiazide (HYDRODIURIL) 25 MG tablet Take 25 mg by mouth daily.      [name] Kitchen KLOR-CON M20 20 MEQ tablet Take 20 mEq by mouth daily.       . Linaclotide 290 MCG CAPS Take 290 mcg by mouth at bedtime.       . metoprolol succinate (TOPROL XL) 25 MG 24 hr tablet Take 1 tablet (25 mg total) by mouth daily. ON IT UNTIL AUG   . naproxen (NAPROSYN) 500 MG tablet Take 500 mg by mouth as needed.      [name] Kitchen olmesartan (BENICAR) 40 MG tablet Take 40 mg by mouth every morning.       .               Review of Systems     Objective:   Physical Exam  Vitals reviewed. Constitutional: She is oriented to person, place, and time. She appears well-nourished. No distress.  HENT:  Head: Normocephalic and atraumatic.  Mouth/Throat: Oropharynx is clear and moist. No  oropharyngeal exudate.  Eyes: Pupils are equal, round, and reactive to light. No scleral icterus.  Neck: Normal range of motion. Neck supple.  Cardiovascular: Normal rate, regular rhythm and normal heart sounds.   Pulmonary/Chest: Breath sounds normal. No respiratory distress.  Abdominal: Soft. Bowel sounds are normal. She exhibits no distension. There is tenderness. There is no rebound and no guarding.  MILD TO MODERATE BLQs, NEGATIVE CARNETT'S SIGN  Musculoskeletal: Normal range of motion. She exhibits no edema.  Lymphadenopathy:    She has no cervical adenopathy.  Neurological: She is alert and oriented to person, place, and time.  NO FOCAL DEFICITS   Psychiatric: She has a normal mood and affect.          Assessment & Plan:

## 2014-01-07 NOTE — Patient Instructions (Signed)
DRINK WATER TO KEEP YOUR URINE LIGHT YELLOW.  FOLLOW A HIGH FIBER DIET. AVOID ITEMS THAT CAUSE PAIN, BLOATING & GAS. SEE INFO BELOW.  COLONOSCOPY WITHIN NEXT 2 WEEKS.   CONTINUE LINZESS AND LAXATIVE AS NEEDED.  MANGEMENT OPTIONS: 1. COLONOSCOPY FOLLOWED BY SURGERY CONSULT FOR EXPLORATORY LAPAROTOMY(EX LAP) OR 1. COLONOSCOPY, REFERRAL TO [facility], [facility], OR [facility], THEN SURGERY REFERRAL.  FOLLOW UP IN 4 MOS.   High-Fiber Diet A high-fiber diet changes your normal diet to include more whole grains, legumes, fruits, and vegetables. Changes in the diet involve replacing refined carbohydrates with unrefined foods. The calorie level of the diet is essentially unchanged. The Dietary Reference Intake (recommended amount) for adult males is 38 grams per day. For adult females, it is 25 grams per day. Pregnant and lactating women should consume 28 grams of fiber per day. Fiber is the intact part of a plant that is not broken down during digestion. Functional fiber is fiber that has been isolated from the plant to provide a beneficial effect in the body. PURPOSE  Increase stool bulk.   Ease and regulate bowel movements.   Lower cholesterol.  INDICATIONS THAT YOU NEED MORE FIBER  Constipation and hemorrhoids.   Uncomplicated diverticulosis (intestine condition) and irritable bowel syndrome.   Weight management.   As a protective measure against hardening of the arteries (atherosclerosis), diabetes, and cancer.   GUIDELINES FOR INCREASING FIBER IN THE DIET  Start adding fiber to the diet slowly. A gradual increase of about 5 more grams (2 slices of whole-wheat bread, 2 servings of most fruits or vegetables, or 1 bowl of high-fiber cereal) per day is best. Too rapid an increase in fiber may result in constipation, flatulence, and bloating.   Drink enough water and fluids to keep your urine clear or pale yellow. Water, juice, or caffeine-free drinks are recommended. Not drinking enough fluid may  cause constipation.   Eat a variety of high-fiber foods rather than one type of fiber.   Try to increase your intake of fiber through using high-fiber foods rather than fiber pills or supplements that contain small amounts of fiber.   The goal is to change the types of food eaten. Do not supplement your present diet with high-fiber foods, but replace foods in your present diet.  INCLUDE A VARIETY OF FIBER SOURCES  Replace refined and processed grains with whole grains, canned fruits with fresh fruits, and incorporate other fiber sources. White rice, white breads, and most bakery goods contain little or no fiber.   Brown whole-grain rice, buckwheat oats, and many fruits and vegetables are all good sources of fiber. These include: broccoli, Brussels sprouts, cabbage, cauliflower, beets, sweet potatoes, white potatoes (skin on), carrots, tomatoes, eggplant, squash, berries, fresh fruits, and dried fruits.   Cereals appear to be the richest source of fiber. Cereal fiber is found in whole grains and bran. Bran is the fiber-rich outer coat of cereal grain, which is largely removed in refining. In whole-grain cereals, the bran remains. In breakfast cereals, the largest amount of fiber is found in those with "bran" in their names. The fiber content is sometimes indicated on the label.   You may need to include additional fruits and vegetables each day.   In baking, for 1 cup white flour, you may use the following substitutions:   1 cup whole-wheat flour minus 2 tablespoons.   1/2 cup white flour plus 1/2 cup whole-wheat flour.

## 2014-01-07 NOTE — Assessment & Plan Note (Signed)
WORSE AND ASSOCIATED WITH CHANGE IN BOWEL HABITS-CONSTIPATION NOW WORSE. SX DO NOT CHANGE WITH FOOD, BMs, OR EXERCISE. SX MOST LIKELY DUE TO FUNCTIONAL ABDOMINAL PAIN, LESS LIKELY R ADENEXAL CYST, OCCULT SB DISEASE, OR COLONIC ULCER.  RECORDS REVIEWED FROM 2011 TO PRESENT. TCS WITHIN NEXT 2 WEEKS. NEED TO REPEAT EXAM OF ILEUM. CONTINUE LINZESS AND PRN LAXATIVE DISCUSSED MANGEMENT OPTIONS: 1. TCS FOLLOWED BY SURGERY FOR EX ALP OR 1. TCS, REFERRAL TO [facility], THEN SURGERY. PT WOULD LIKE TO PROCEED WITH TCS AND DISCUSS NEXT STEP AFTER TCS. FOLLOW UP IN 4 MOS.

## 2014-01-08 NOTE — Progress Notes (Signed)
cc'd to pcp 

## 2014-01-15 ENCOUNTER — Ambulatory Visit: Payer: BC Managed Care – PPO | Admitting: Gastroenterology

## 2014-01-28 ENCOUNTER — Encounter (HOSPITAL_COMMUNITY): Payer: Self-pay | Admitting: Pharmacy Technician

## 2014-02-06 ENCOUNTER — Encounter (HOSPITAL_COMMUNITY)
Admission: RE | Disposition: A | Discharge status: Home / Self Care | Payer: Self-pay | Source: Ambulatory Visit | Attending: Gastroenterology

## 2014-02-06 ENCOUNTER — Ambulatory Visit (HOSPITAL_COMMUNITY)
Admission: RE | Admit: 2014-02-06 | Discharge: 2014-02-06 | Disposition: A | Discharge status: Home / Self Care | Payer: BC Managed Care – PPO | Source: Ambulatory Visit | Attending: Gastroenterology | Admitting: Gastroenterology

## 2014-02-06 ENCOUNTER — Encounter (HOSPITAL_COMMUNITY): Payer: Self-pay | Admitting: *Deleted

## 2014-02-06 DIAGNOSIS — R198 Other specified symptoms and signs involving the digestive system and abdomen: Secondary | ICD-10-CM

## 2014-02-06 DIAGNOSIS — I10 Essential (primary) hypertension: Secondary | ICD-10-CM | POA: Insufficient documentation

## 2014-02-06 DIAGNOSIS — Q438 Other specified congenital malformations of intestine: Secondary | ICD-10-CM | POA: Insufficient documentation

## 2014-02-06 DIAGNOSIS — E785 Hyperlipidemia, unspecified: Secondary | ICD-10-CM | POA: Insufficient documentation

## 2014-02-06 DIAGNOSIS — E669 Obesity, unspecified: Secondary | ICD-10-CM | POA: Insufficient documentation

## 2014-02-06 DIAGNOSIS — R109 Unspecified abdominal pain: Secondary | ICD-10-CM | POA: Insufficient documentation

## 2014-02-06 DIAGNOSIS — R194 Change in bowel habit: Secondary | ICD-10-CM

## 2014-02-06 DIAGNOSIS — K648 Other hemorrhoids: Secondary | ICD-10-CM | POA: Insufficient documentation

## 2014-02-06 HISTORY — PX: COLONOSCOPY: SHX5424

## 2014-02-06 SURGERY — COLONOSCOPY
Anesthesia: Moderate Sedation

## 2014-02-06 MED ORDER — SODIUM CHLORIDE 0.9 % IV SOLN
INTRAVENOUS | Status: DC
  Administered 2014-02-06: 08:00:00 via INTRAVENOUS

## 2014-02-06 MED ORDER — PROMETHAZINE HCL 25 MG/ML IJ SOLN
INTRAMUSCULAR | Status: AC
  Filled 2014-02-06: qty 1

## 2014-02-06 MED ORDER — MEPERIDINE HCL 100 MG/ML IJ SOLN
INTRAMUSCULAR | Status: DC | PRN
  Administered 2014-02-06 (×4): 25 mg via INTRAVENOUS

## 2014-02-06 MED ORDER — PROMETHAZINE HCL 25 MG/ML IJ SOLN
INTRAMUSCULAR | Status: DC | PRN
  Administered 2014-02-06: 12.5 mg via INTRAVENOUS

## 2014-02-06 MED ORDER — MIDAZOLAM HCL 5 MG/5ML IJ SOLN
INTRAMUSCULAR | Status: DC | PRN
  Administered 2014-02-06: 2 mg via INTRAVENOUS
  Administered 2014-02-06 (×3): 1 mg via INTRAVENOUS
  Administered 2014-02-06: 2 mg via INTRAVENOUS
  Administered 2014-02-06: 1 mg via INTRAVENOUS

## 2014-02-06 MED ORDER — SODIUM CHLORIDE 0.9 % IJ SOLN
INTRAMUSCULAR | Status: AC
  Filled 2014-02-06: qty 10

## 2014-02-06 MED ORDER — SIMETHICONE 40 MG/0.6ML PO SUSP
ORAL | Status: DC | PRN
  Administered 2014-02-06: 09:00:00

## 2014-02-06 MED ORDER — MEPERIDINE HCL 100 MG/ML IJ SOLN
INTRAMUSCULAR | Status: AC
  Filled 2014-02-06: qty 2

## 2014-02-06 MED ORDER — MIDAZOLAM HCL 5 MG/5ML IJ SOLN
INTRAMUSCULAR | Status: AC
  Filled 2014-02-06: qty 10

## 2014-02-06 NOTE — Interval H&P Note (Signed)
History and Physical Interval Note:  02/06/2014 8:37 AM  [name]  has presented today for surgery, with the diagnosis of CHANGE IN BOWEL HABITS AND ABDOMINAL PAIN  The various methods of treatment have been discussed with the patient and family. After consideration of risks, benefits and other options for treatment, the patient has consented to  Procedure(s) with comments: COLONOSCOPY (N/A) - 8:30 as a surgical intervention .  The patient's history has been reviewed, patient examined, no change in status, stable for surgery.  I have reviewed the patient's chart and labs.  Questions were answered to the patient's satisfaction.     Eaton CorporationSandi Jazmeen Axtell

## 2014-02-06 NOTE — Op Note (Addendum)
[facility] [address] [place], [id number]   COLONOSCOPY PROCEDURE REPORT  PATIENT: [name], Emily A.  MR#: #[id number] BIRTHDATE: 01/18/1966 , 48  yrs. old GENDER: Female ENDOSCOPIST: Jonette EvaSandi Fields, MD REFERRED XB:JYNWBY:John Phillips OdorGolding, M.D. PROCEDURE DATE:  02/06/2014 PROCEDURE:   Colonoscopy, diagnostic INDICATIONS:Change in bowel habits. MEDICATIONS: Demerol 100 mg IV, Versed 6 mg IV, and Promethazine (Phenergan) 12.5mg  IV  DESCRIPTION OF PROCEDURE:    Physical exam was performed.  Informed consent was obtained from the patient after explaining the benefits, risks, and alternatives to procedure.  The patient was connected to monitor and placed in left lateral position. Continuous oxygen was provided by nasal cannula and IV medicine administered through an indwelling cannula.  After administration of sedation and rectal exam, the patients rectum was intubated and the EC-[id number] ([id number]  colonoscope was advanced under direct visualization to the ileum.  The scope was removed slowly by carefully examining the color, texture, anatomy, and integrity mucosa on the way out.  The patient was recovered in endoscopy and discharged home in satisfactory condition.     COLON FINDINGS: The mucosa appeared normal in the terminal ileum.  , The LEFT colon IS redundant.  COLON OVERTUBE USED TO REDUCE LOOPING.  The colon mucosa was otherwise normal.  , and Moderate sized internal hemorrhoids were found.  PREP QUALITY: good.  CECAL W/D TIME: 13 minutes    COMPLICATIONS: ABDOMINAL PAIN/DIARRHEA AFTER TCS. CT A/P JUN 29-NAIAP  ENDOSCOPIC IMPRESSION: 1.   Normal mucosa in the terminal ileum 2.   The LEFT colon IS redundant 3.   The colon mucosa was otherwise normal 4.   Moderate sized internal hemorrhoids 5. No SOURCE FOR CHANGE IN BOWEL HABITS IDETIFIED. CONSTIPATION MOST LIKELY DUE TO FUNCTIONAL CONSTIPATION.  RECOMMENDATIONS: INSTRUCTIONS given TO MANAGE  CONSTIPATION. CONTINUE LINZESS FOR CONSTIPATION. DRINK WATER TO KEEP URINE LIGHT YELLOW. FOLLOW A HIGH FIBER DIET. FOLLOW UP IN SEP 2015.  Next colonoscopy in 10 years with an overtube..     _______________________________ Rosalie DoctoreSignedJonette Eva:  Sandi Fields, MD 03/11/2014 4:12 PM Revised: 03/11/2014 4:12 PM

## 2014-02-06 NOTE — Discharge Instructions (Signed)
You DID NOT HAVE ANY POLYPS. You have a redundnat colon but it is otherwise normal. YOUR SMALL BOWEL LOOKED NORMAL. You have small internal hemorrhoids.   SEE INSTRUCTIONS BELOW TO MANAGE YOUR CONSTIPATION.  CONTINUE LINZESS FOR CONSTIPATION.  DRINK WATER TO KEEP URINE LIGHT YELLOW.  FOLLOW A HIGH FIBER DIET. SEE INFO BELOW.  FOLLOW UP IN SEP 2015.  Next colonoscopy in 10 years.   Colonoscopy Care After Read the instructions outlined below and refer to this sheet in the next week. These discharge instructions provide you with general information on caring for yourself after you leave the hospital. While your treatment has been planned according to the most current medical practices available, unavoidable complications occasionally occur. If you have any problems or questions after discharge, call DR. FIELDS, [phone].  ACTIVITY  You may resume your regular activity, but move at a slower pace for the next 24 hours.   Take frequent rest periods for the next 24 hours.   Walking will help get rid of the air and reduce the bloated feeling in your belly (abdomen).   No driving for 24 hours (because of the medicine (anesthesia) used during the test).   You may shower.   Do not sign any important legal documents or operate any machinery for 24 hours (because of the anesthesia used during the test).    NUTRITION  Drink plenty of fluids.   You may resume your normal diet as instructed by your doctor.   Begin with a light meal and progress to your normal diet. Heavy or fried foods are harder to digest and may make you feel sick to your stomach (nauseated).   Avoid alcoholic beverages for 24 hours or as instructed.    MEDICATIONS  You may resume your normal medications.   WHAT YOU CAN EXPECT TODAY  Some feelings of bloating in the abdomen.   Passage of more gas than usual.   Spotting of blood in your stool or on the toilet paper  .  IF YOU HAD POLYPS REMOVED DURING  THE COLONOSCOPY:  Eat a soft diet IF YOU HAVE NAUSEA, BLOATING, ABDOMINAL PAIN, OR VOMITING.    FINDING OUT THE RESULTS OF YOUR TEST Not all test results are available during your visit. DR. Darrick PennaFIELDS WILL CALL YOU WITHIN 7 DAYS OF YOUR PROCEDUE WITH YOUR RESULTS. Do not assume everything is normal if you have not heard from DR. FIELDS IN ONE WEEK, CALL HER OFFICE AT [phone].  SEEK IMMEDIATE MEDICAL ATTENTION AND CALL THE OFFICE: [phone] IF:  You have more than a spotting of blood in your stool.   Your belly is swollen (abdominal distention).   You are nauseated or vomiting.   You have a temperature over 101F.   You have abdominal pain or discomfort that is severe or gets worse throughout the day.   Constipation in Adults Constipation is having fewer than 2 bowel movements per week. Usually, the stools are hard. As we grow older, constipation is more common. If you try to fix constipation with laxatives, the problem may get worse. This is because laxatives taken over a long period of time make the colon muscles weaker. A low-fiber diet, not taking in enough fluids, and taking some medicines may make these problems worse.  HOME CARE INSTRUCTIONS  Constipation is usually best cared for without medicines. Increasing dietary fiber and eating more fruits and vegetables is the best way to manage constipation.   Slowly increase fiber intake to 25 to 38 grams  per day. Whole grains, fruits, vegetables, and legumes are good sources of fiber. A dietitian can further help you incorporate high-fiber foods into your diet.   Drink enough water and fluids to keep your urine clear or pale yellow.   A fiber supplement may be added to your diet if you cannot get enough fiber from foods.   Increasing your activities also helps improve regularity.   Stronger measures, such as magnesium sulfate, should be avoided if possible. This may cause uncontrollable diarrhea. Using magnesium sulfate may  not allow you time to make it to the bathroom.    High-Fiber Diet A high-fiber diet changes your normal diet to include more whole grains, legumes, fruits, and vegetables. Changes in the diet involve replacing refined carbohydrates with unrefined foods. The calorie level of the diet is essentially unchanged. The Dietary Reference Intake (recommended amount) for adult males is 38 grams per day. For adult females, it is 25 grams per day. Pregnant and lactating women should consume 28 grams of fiber per day. Fiber is the intact part of a plant that is not broken down during digestion. Functional fiber is fiber that has been isolated from the plant to provide a beneficial effect in the body. PURPOSE  Increase stool bulk.   Ease and regulate bowel movements.   Lower cholesterol.  INDICATIONS THAT YOU NEED MORE FIBER  Constipation and hemorrhoids.   Uncomplicated diverticulosis (intestine condition) and irritable bowel syndrome.   Weight management.   As a protective measure against hardening of the arteries (atherosclerosis), diabetes, and cancer.   GUIDELINES FOR INCREASING FIBER IN THE DIET  Start adding fiber to the diet slowly. A gradual increase of about 5 more grams (2 slices of whole-wheat bread, 2 servings of most fruits or vegetables, or 1 bowl of high-fiber cereal) per day is best. Too rapid an increase in fiber may result in constipation, flatulence, and bloating.   Drink enough water and fluids to keep your urine clear or pale yellow. Water, juice, or caffeine-free drinks are recommended. Not drinking enough fluid may cause constipation.   Eat a variety of high-fiber foods rather than one type of fiber.   Try to increase your intake of fiber through using high-fiber foods rather than fiber pills or supplements that contain small amounts of fiber.   The goal is to change the types of food eaten. Do not supplement your present diet with high-fiber foods, but replace foods in  your present diet.  INCLUDE A VARIETY OF FIBER SOURCES  Replace refined and processed grains with whole grains, canned fruits with fresh fruits, and incorporate other fiber sources. White rice, white breads, and most bakery goods contain little or no fiber.   Brown whole-grain rice, buckwheat oats, and many fruits and vegetables are all good sources of fiber. These include: broccoli, Brussels sprouts, cabbage, cauliflower, beets, sweet potatoes, white potatoes (skin on), carrots, tomatoes, eggplant, squash, berries, fresh fruits, and dried fruits.   Cereals appear to be the richest source of fiber. Cereal fiber is found in whole grains and bran. Bran is the fiber-rich outer coat of cereal grain, which is largely removed in refining. In whole-grain cereals, the bran remains. In breakfast cereals, the largest amount of fiber is found in those with "bran" in their names. The fiber content is sometimes indicated on the label.   You may need to include additional fruits and vegetables each day.   In baking, for 1 cup white flour, you may use the following substitutions:  1 cup whole-wheat flour minus 2 tablespoons.   1/2 cup white flour plus 1/2 cup whole-wheat flour.    Hemorrhoids Hemorrhoids are dilated (enlarged) veins around the rectum. Sometimes clots will form in the veins. This makes them swollen and painful. These are called thrombosed hemorrhoids. Causes of hemorrhoids include:  Constipation.   Straining to have a bowel movement.   HEAVY LIFTING HOME CARE INSTRUCTIONS  Eat a well balanced diet and drink 6 to 8 glasses of water every day to avoid constipation. You may also use a bulk laxative.   Avoid straining to have bowel movements.   Keep anal area dry and clean.   Do not use a donut shaped pillow or sit on the toilet for long periods. This increases blood pooling and pain.   Move your bowels when your body has the urge; this will require less straining and will decrease  pain and pressure.

## 2014-02-06 NOTE — H&P (View-Only) (Signed)
 Subjective:    Patient ID: [name], female    DOB: 06/06/1966, 48 y.o.   MRN: [id number]  GOLDING, JOHN CABOT, MD  HPI Pain in rlq/r flank pain. Sx for at least she saw me last. 6 mos ago woke her up in the middle of the night and she went to the ED.  NO TRIGGERS. PAIN DAILY-MORE INTENSE THAN THAN OTHERS. BAD DAYS: EVER DAY. CAN GO AWAY. WHEN IT HITS SHE CAN'T WALK OR STAND UP.  STILL HAVING A MENSTRUAL CYCLE. FELT WORSE AROUND CYCLE, BUT GYN COULDN'T FIND ANYTHING WRONG. SHARP PAIN MAY LAST 5 MINS OR HRS. THINKS SHE HAD A EX LAP. MAY HAVE NAUSEA/VOMITING WITH SEVERE PAIN. LOST 25 LBS-WANTED TO LOSE WEIGHT. GOT MARRIED.  BMs: MAY BE STICKY BUT NOT BLACK. BMs: EVERY 2 WEEKS. NOW ON LINZESS WITH LAXATIVE BM EVERY 7-10 DAYS. NO STRESS. STOPPED WALKING BECAUSE HER HEAR RATE INCREASED AND SHE WAS SCARED. PT DENIES FEVER, CHILLS, BRBPR, melena, diarrhea, problems swallowing, problems with sedation, CHEST PAIN, SOB, OR heartburn or indigestion.  Past Medical History  Diagnosis Date  . Hypertension   . Hyperlipidemia   . Thyroid disease   . Stress incontinence   . Palpitations   . Obesity    Past Surgical History  Procedure Laterality Date  . Cholecystectomy    . Doppler echocardiography  March 2011; July 2014    Normal EF, essentially normal; no valvular lesions.  No PFO or ASD  . Treadmill myoview nuclear stress test  March 2011    6 minutes, 7 METS; Diaphragmatic attenuation otherwise no evidence of ischemia or infarction  . Cardionet monitor  July 2014     Sinus rhythm, sinus tachycardia.  Shortness of breath, dizziness, and chest pain noted with sinus tachycardia   Allergies  Allergen Reactions  . Doxycycline Itching and Rash    Current Outpatient Prescriptions  Medication Sig Dispense Refill  . ALPRAZolam (XANAX) 0.5 MG tablet Take 0.5 mg by mouth 3 (three) times daily as needed for anxiety or sleep.   NONE IN SINCE LAST YEAR FOR BAD HAs    . amLODipine (NORVASC) 10  MG tablet Take 5 mg by mouth every evening.      . atorvastatin (LIPITOR) 20 MG tablet Take 20 mg by mouth daily at 6 PM.       . diphenhydrAMINE (BENADRYL) 25 MG tablet Take 25 mg by mouth every 6 (six) hours as needed.      . hydrochlorothiazide (HYDRODIURIL) 25 MG tablet Take 25 mg by mouth daily.      . KLOR-CON M20 20 MEQ tablet Take 20 mEq by mouth daily.       . Linaclotide 290 MCG CAPS Take 290 mcg by mouth at bedtime.       . metoprolol succinate (TOPROL XL) 25 MG 24 hr tablet Take 1 tablet (25 mg total) by mouth daily. ON IT UNTIL AUG   . naproxen (NAPROSYN) 500 MG tablet Take 500 mg by mouth as needed.      . olmesartan (BENICAR) 40 MG tablet Take 40 mg by mouth every morning.       .               Review of Systems     Objective:   Physical Exam  Vitals reviewed. Constitutional: She is oriented to person, place, and time. She appears well-nourished. No distress.  HENT:  Head: Normocephalic and atraumatic.  Mouth/Throat: Oropharynx is clear and moist. No   oropharyngeal exudate.  Eyes: Pupils are equal, round, and reactive to light. No scleral icterus.  Neck: Normal range of motion. Neck supple.  Cardiovascular: Normal rate, regular rhythm and normal heart sounds.   Pulmonary/Chest: Breath sounds normal. No respiratory distress.  Abdominal: Soft. Bowel sounds are normal. She exhibits no distension. There is tenderness. There is no rebound and no guarding.  MILD TO MODERATE BLQs, NEGATIVE CARNETT'S SIGN  Musculoskeletal: Normal range of motion. She exhibits no edema.  Lymphadenopathy:    She has no cervical adenopathy.  Neurological: She is alert and oriented to person, place, and time.  NO FOCAL DEFICITS   Psychiatric: She has a normal mood and affect.          Assessment & Plan:

## 2014-02-09 ENCOUNTER — Encounter (HOSPITAL_COMMUNITY): Payer: Self-pay | Admitting: Gastroenterology

## 2014-02-09 ENCOUNTER — Other Ambulatory Visit: Payer: Self-pay | Admitting: Gastroenterology

## 2014-02-09 ENCOUNTER — Ambulatory Visit (HOSPITAL_COMMUNITY)
Admission: RE | Admit: 2014-02-09 | Discharge: 2014-02-09 | Disposition: A | Discharge status: Home / Self Care | Payer: BC Managed Care – PPO | Source: Ambulatory Visit | Attending: Gastroenterology | Admitting: Gastroenterology

## 2014-02-09 ENCOUNTER — Telehealth: Payer: Self-pay | Admitting: Gastroenterology

## 2014-02-09 DIAGNOSIS — G8929 Other chronic pain: Secondary | ICD-10-CM

## 2014-02-09 DIAGNOSIS — R1013 Epigastric pain: Secondary | ICD-10-CM

## 2014-02-09 DIAGNOSIS — Z9889 Other specified postprocedural states: Secondary | ICD-10-CM

## 2014-02-09 DIAGNOSIS — R1031 Right lower quadrant pain: Secondary | ICD-10-CM | POA: Insufficient documentation

## 2014-02-09 MED ORDER — IOHEXOL 300 MG/ML  SOLN
100.0000 mL | Freq: Once | INTRAMUSCULAR | Status: AC | PRN
  Administered 2014-02-09: 100 mL via INTRAVENOUS

## 2014-02-09 NOTE — Telephone Encounter (Signed)
Mrs. [name] called c/o a lot of right sided abdominal pain pain severe at times, pain comes and goes. All symptoms since Friday after TCS, please advise. Patient states multiple bowel movements Saturday

## 2014-02-09 NOTE — Telephone Encounter (Signed)
Patient is on the way Radiology for CT scan

## 2014-02-09 NOTE — Telephone Encounter (Signed)
Spoke with pt- she said she is having pain that goes from her back around to her R side. It is not cramping pain. On Saturday she had 12-15 episodes of watery diarrhea. The pain comes and goes and today she could barely walk when she got up. No blood seen, no vomiting. +nausea, taking phenergan for that. Spoke with AS about pt- need a stat ct. Pt is aware. She is at work, Benedetto GoadLeighann is going to [employer] insurance and schedule.

## 2014-02-10 ENCOUNTER — Other Ambulatory Visit: Payer: Self-pay | Admitting: Gastroenterology

## 2014-02-10 MED ORDER — HYOSCYAMINE SULFATE 0.125 MG SL SUBL: 0.1250 mg | SUBLINGUAL_TABLET | SUBLINGUAL | Status: DC | PRN

## 2014-02-10 NOTE — Progress Notes (Signed)
Quick Note:  CT negative.  Attempted to inform patient. If persistent loose stools, check Cdiff PCR, stool culture, and Giardia ______

## 2014-02-10 NOTE — Telephone Encounter (Signed)
I attempted to call patient at work and at home to inform of CT results. No acute finding.  Nira RetortAnna W. Sams, ANP-BC [facility]

## 2014-02-10 NOTE — Progress Notes (Signed)
Quick Note:  3 loose stools today. Persistent diarrhea. NO fever. No rectal bleeding.  RLQ to lower abdominal pain, intermittent, not associated with loose stools.  Patient still taking Linzess.  I have asked her to stop Linzess. Will send in Levsin prn. Contact us if further loose stools, and we will do stool studies. ______

## 2014-03-11 NOTE — Telephone Encounter (Signed)
REVIEWED.  

## 2014-03-25 ENCOUNTER — Telehealth: Payer: Self-pay | Admitting: Cardiology

## 2014-03-26 NOTE — Telephone Encounter (Signed)
Closed encounter °

## 2014-03-30 ENCOUNTER — Encounter: Payer: Self-pay | Admitting: Cardiology

## 2014-03-30 ENCOUNTER — Ambulatory Visit (INDEPENDENT_AMBULATORY_CARE_PROVIDER_SITE_OTHER): Payer: BC Managed Care – PPO | Admitting: Cardiology

## 2014-03-30 VITALS — BP 118/72 | HR 71 | Ht 66.0 in | Wt 198.7 lb

## 2014-03-30 DIAGNOSIS — I10 Essential (primary) hypertension: Secondary | ICD-10-CM

## 2014-03-30 DIAGNOSIS — R002 Palpitations: Secondary | ICD-10-CM

## 2014-03-30 DIAGNOSIS — R011 Cardiac murmur, unspecified: Secondary | ICD-10-CM

## 2014-03-30 DIAGNOSIS — E785 Hyperlipidemia, unspecified: Secondary | ICD-10-CM

## 2014-03-30 DIAGNOSIS — E669 Obesity, unspecified: Secondary | ICD-10-CM

## 2014-03-30 MED ORDER — METOPROLOL SUCCINATE ER 25 MG PO TB24: 25.0000 mg | ORAL_TABLET | Freq: Every day | ORAL | Status: DC

## 2014-03-30 NOTE — Patient Instructions (Signed)
CONTINUE WITH CURRENT MEDICATION  IF YOU YOU HAVE AN INCREASE HEART RATE, YOU MAY TAKE AN EXTRA  1/2 TABLET METOPROLOL AND ONLY TAKE 1/2 TABLET AMLODIPINE THAT EVENING.    Your physician wants you to follow-up in 6 month Dr [name]. You will receive a reminder letter in the mail two months in advance. If you don't receive a letter, please call our office to schedule the follow-up appointment.

## 2014-04-01 ENCOUNTER — Encounter: Payer: Self-pay | Admitting: Cardiology

## 2014-04-01 DIAGNOSIS — E669 Obesity, unspecified: Secondary | ICD-10-CM | POA: Insufficient documentation

## 2014-04-01 NOTE — Progress Notes (Signed)
PCP: Colette RibasGOLDING, JOHN CABOT, MD  Clinic Note: Chief Complaint  Patient presents with  . 3 month visit    some chest pain,palp,  sob , edema    HPI: [name] is a 48 y.o. female with a Cardiovascular Problem List below who presents today for 3 month follow up after a recent walk-in visit with Dr. Marvis MoellerJohnson Berry for palpitations. I last saw her in July 2014 in followup for chest discomfort and palpitations. At that time we discussed whether or not we would start on beta blockers and she decided not to. However, the she came in and made with notably significant palpitations and was started on Toprol 25 mg by Dr. Allyson SabalBerry. Following her last visit, she had a Myoview Stress Test that showed no evidence of ischemia or infarction  Interval History: She returns today for three-month followup after starting the metoprolol. She states that since being on metoprolol the palpitations have significantly improved. She did note some dyspnea that she's been having is much improved as well. She is now walking almost 30 minutes a day without any major problems with exertional dyspnea and certainly no chest tightness or pressure with rest or exertion. Despite having the palpitations and that she still continues to deny any syncope or near syncopal episodes, TIA or amaurosis fugax symptoms. Minimal occasional orthostatic symptoms. She does have musculoskeletal symptoms will arthralgias and myalgias with prolonged strenuous exercise, but not consistent with claudication.  Past Medical History  Diagnosis Date  . Essential hypertension   . Hyperlipidemia LDL goal <130   . Thyroid disease   . Stress incontinence   . Palpitations     Normal echocardiogram with no valvular lesions. Normal nuclear stress test in March 2011 and August 2014  . Obesity     Prior Cardiac Evaluation and Past Surgical History: Procedure Laterality Date  . Doppler echocardiography  March 2011; July 2014    Normal EF,  essentially normal; no valvular lesions.  No PFO or ASD  . Treadmill myoview nuclear stress test  March 2011, August 2014    a) 2011: 6 minutes, 7 METS; Diaphragmatic attenuation otherwise no evidence of ischemia or infarction; b) 2014: Walked at 9 min, 10.1 METs, no ischemia or infarction with normal EF 66%. No EKG changes.  Shon Hough. Cardionet monitor  July 2014     Sinus rhythm, sinus tachycardia.  Shortness of breath, dizziness, and chest pain noted with sinus tachycardia    MEDICATIONS AND ALLERGIES REVIEWED IN EPIC No Change in Social and Family History  ROS: A comprehensive Review of Systems - was performed Review of Systems  Constitutional: Negative for fever, chills and malaise/fatigue.  HENT: Negative for congestion and nosebleeds.   Respiratory: Negative for cough, shortness of breath, wheezing and stridor.   Cardiovascular: Negative.        Per history of present illness  Gastrointestinal: Negative for blood in stool and melena.  Genitourinary: Negative for dysuria, hematuria and flank pain.  Neurological: Negative for dizziness, sensory change, speech change, focal weakness, seizures, loss of consciousness and weakness.  All other systems reviewed and are negative.   Wt Readings from Last 3 Encounters:  03/30/14 198 lb 11.2 oz (90.13 kg)  02/06/14 197 lb (89.359 kg)  02/06/14 197 lb (89.359 kg)    PHYSICAL EXAM BP 118/72  Pulse 71  Ht 5\' 6"  (1.676 m)  Wt 198 lb 11.2 oz (90.13 kg)  BMI 32.09 kg/m2 General appearance: alert, cooperative, no distress and Pleasant mood and  affect.  Neck: no carotid bruit and no JVD  Lungs: clear to auscultation bilaterally, normal percussion bilaterally and Nonlabored, good air movement.  Heart: normal apical impulse, regular rate and rhythm, S1, S2 normal, no S3 or S4 and systolic murmur: early systolic 1/6, low pitch and harsh at lower left sternal border  Abdomen: soft, non-tender; bowel sounds normal; no masses, no organomegaly    Extremities: extremities normal, atraumatic, no cyanosis or edema, no edema; No redness or tenderness in the calves or thighs and no ulcers; Pulses: 2+ and symmetric  Neurologic: A&O X 3, normal strength and tone. Normal symmetric reflexes. Normal coordination and gait   Adult ECG Report  Rate: 71 ;  Rhythm: normal sinus rhythm and Normal voltage, intervals, durations and axis. Normal EKG  Recent Labs not available:   ASSESSMENT / PLAN: Palpitations Symptoms of notably improved with the initiation of beta blocker. We will continue on with the 25 mg dose. Resting heart rate is stable. No signs of chronotropic incompetence.  As noted previously, and relatively normal echocardiogram and Myoview stress test. Would not pursue additional ischemic evaluation at this time  Essential hypertension Excellent control on ARB, calcium channel blocker, HCTZ with the addition of Toprol  Hyperlipidemia with target LDL less than 130 On statin. Monitored by PCP.   Obesity (BMI 30-39.9) With truncal obesity, evidently elevated triglycerides and hypertension, she is borderline meeting criteria for metabolic syndrome which is a risk equivalent of CAD just as diabetes is. With this in mind it was very important to discuss dietary modifications and weight loss/exercise increasing. We talked about different types of diets including the zone diet, Mediterranean/DASH diet. Essentially limiting protein, increasing fruits and vegetables less starchy vegetables. Could consider addition of concentrated omega-3 fatty acids. Avoid processed foods including vegetables. Preferably use olive oil  Murmur Nothing noted on echocardiogram to explain her murmur. Likely innocent flow murmur.    Orders Placed This Encounter  Procedures  . EKG 12-Lead   Meds ordered this encounter  Medications  . metoprolol succinate (TOPROL XL) 25 MG 24 hr tablet    Sig: Take 1 tablet (25 mg total) by mouth daily.    Dispense:  30  tablet    Refill:  11    Followup: 6 months  HARDING,DAVID W, M.D., M.S. Interventional Cardiologist   Pager # [phone]

## 2014-04-01 NOTE — Assessment & Plan Note (Signed)
On statin. Monitored by PCP. 

## 2014-04-01 NOTE — Assessment & Plan Note (Signed)
With truncal obesity, evidently elevated triglycerides and hypertension, she is borderline meeting criteria for metabolic syndrome which is a risk equivalent of CAD just as diabetes is. With this in mind it was very important to discuss dietary modifications and weight loss/exercise increasing. We talked about different types of diets including the zone diet, Mediterranean/DASH diet. Essentially limiting protein, increasing fruits and vegetables less starchy vegetables. Could consider addition of concentrated omega-3 fatty acids. Avoid processed foods including vegetables. Preferably use olive oil

## 2014-04-01 NOTE — Assessment & Plan Note (Addendum)
Symptoms of notably improved with the initiation of beta blocker. We will continue on with the 25 mg dose. Resting heart rate is stable. No signs of chronotropic incompetence.  As noted previously, and relatively normal echocardiogram and Myoview stress test. Would not pursue additional ischemic evaluation at this time

## 2014-04-01 NOTE — Assessment & Plan Note (Signed)
Excellent control on ARB, calcium channel blocker, HCTZ with the addition of Toprol

## 2014-04-01 NOTE — Assessment & Plan Note (Signed)
Nothing noted on echocardiogram to explain her murmur. Likely innocent flow murmur.

## 2014-04-12 ENCOUNTER — Other Ambulatory Visit: Payer: Self-pay | Admitting: Cardiology

## 2014-04-16 ENCOUNTER — Encounter: Payer: Self-pay | Admitting: Gastroenterology

## 2014-05-26 ENCOUNTER — Ambulatory Visit: Payer: BC Managed Care – PPO | Admitting: Gastroenterology

## 2014-08-11 ENCOUNTER — Other Ambulatory Visit: Payer: Self-pay | Admitting: Cardiology

## 2014-08-11 NOTE — Telephone Encounter (Signed)
Metoprolol refilled on 03/30/2014 - #30 with 11 refills

## 2014-10-06 ENCOUNTER — Encounter: Payer: Self-pay | Admitting: Gastroenterology

## 2014-10-07 ENCOUNTER — Telehealth: Payer: Self-pay | Admitting: Gastroenterology

## 2014-10-07 NOTE — Telephone Encounter (Signed)
Patient needs an appointment w/Dr/ Gena FrayFields-[facility] Funct =  ions=0A=0A=0ARef by:=C2=A0 Terie PurserSamantha Jackson, PAC/Lawrence Fusco, MD=0AGroup=  NPI:=C2=A0 [id number]=W2=[id number]=C2=A0 Good for 1 year unlimited visits=0A=0A=0APleas=  e send us the appt date & time for our records.=C2=A0 If patient has [employer]=  Gold Plus and needs a referral I will send over as soon as I receive date =  & time.=0APlease send us correspondence for the visit including any and all=  labs done=0A=0A=0A=0A=0A=0A=0A=0A=0A- Sent by Desk Referrals, Receptionais=  t on behalf of [facility]

## 2014-10-07 NOTE — Telephone Encounter (Signed)
PATIENT ON SCHEDULE FOR 10/08/14 WITH ERIC GILL

## 2014-10-08 ENCOUNTER — Encounter: Payer: Self-pay | Admitting: Nurse Practitioner

## 2014-10-08 ENCOUNTER — Other Ambulatory Visit: Payer: Self-pay

## 2014-10-08 ENCOUNTER — Other Ambulatory Visit: Payer: Self-pay | Admitting: Nurse Practitioner

## 2014-10-08 ENCOUNTER — Ambulatory Visit (INDEPENDENT_AMBULATORY_CARE_PROVIDER_SITE_OTHER): Payer: BLUE CROSS/BLUE SHIELD | Admitting: Nurse Practitioner

## 2014-10-08 VITALS — BP 120/78 | HR 86 | Temp 97.3°F | Ht 66.0 in | Wt 191.4 lb

## 2014-10-08 DIAGNOSIS — R740 Nonspecific elevation of levels of transaminase and lactic acid dehydrogenase [LDH]: Secondary | ICD-10-CM

## 2014-10-08 DIAGNOSIS — R7401 Elevation of levels of liver transaminase levels: Secondary | ICD-10-CM

## 2014-10-08 NOTE — Assessment & Plan Note (Signed)
Recent sligh but upward trending elevation in transaminases with remaining liver function parameters remaining normal. Also with muscle cramping and elevated CK and CRP. Patient is on a statin. No other red flag/warning signs (overt GI bleed, jaundice, dark/discolored urine). Recent CT less than a year ago for abdominal pain unremarkable and specifically with no hepatobiliary abnormalities. Likely cause a side effect of statin therapy. Will hold statin for now, order RUQ limited liver US, recheck hepatic function panel in 1 month with subsequent follow-up.

## 2014-10-08 NOTE — Patient Instructions (Signed)
1. Stop taking your statin medication for now. 2. We have ordered an ultrasound of your liver 3. I am also ordering a repeat hepatic (liver) function panel to relook at your liver enzymes in 1 month. 4. Return for follow-up in 1 month to review progress and make any additional changes 5. Call us in the meantime if you have any problems or questions.

## 2014-10-08 NOTE — Progress Notes (Signed)
Referring Provider: Sharilyn Sites, MD Primary Care Physician:  Purvis Kilts, MD Primary GI: Dr. Oneida Alar  Chief Complaint  Patient presents with  . Abnormal Lab    HPI:   49 year old female presents on referral from PCP for elevated LFTs. OV with PCP 2/11 states patient with myalgias and AST/ALT 45/64 with bili, alk phos normal (labs 08/10/14). Repeated labs that day (09/24/14) show AST/ALT increased to 104/134, remainder of hepatic function panel (albumin, protein, direct bili, total bili, alk phos) all normal. CK was also elevated at 192 with normal CK MB. CRP high at 5.7, hepatitis panel (Hep A Ab IgM, HeBsAg, Hep B core Ab IgM, and Hep C) all negative. Had recently had abdominal pain after colonoscopy 02/09/14 which had no acute process, and specifically "The liver demonstrates no focal abnormality. There is no intrahepatic or extrahepatic biliary ductal dilatation. The gallbladder is surgically absent." Of note, the patient is listed on a statin medication. Colonoscopy on 02/06/14 showed normal mucosa in the terminal ileum, left colon redundancy, colonic mucosa otherwise normal, moderate size internal hemorrhouids, no identified source for bowel habit change, constipation likely functional.  Today states she's always had right side abdominal pain, which occurs daily. Changes in severity, but is constant. Abdominal pain has been occurring for about the past 2-3 years. Has a bowel movement "maybe once a week" and will not go without taking a medication to go. Currently on Linzess once a day or every other day. Takes fiber supplements and fibrous foods. Drinks about 60-80 oz water a day, stopped drinking tea, but usually drinks water or unsweetened lemonade. Denies hematochezia or melena. Occasional nausea, no vomiting. Denies jaundice, denies tea colored urine. Denies fever, chills, LE edema, syncope, shortness of breath, unintentional weight loss, change in appetite. Is having some insomnia  as well. Denies any other upper or lower GI symtoms.  Past Medical History  Diagnosis Date  . Essential hypertension   . Hyperlipidemia LDL goal <130   . Thyroid disease     isolated, one month only, not on synthroid since 2013  . Stress incontinence   . Palpitations     Normal echocardiogram with no valvular lesions. Normal nuclear stress test in March 2011 and August 2014  . Obesity   . Constipation - functional   . Ectopic pregnancy     Past Surgical History  Procedure Laterality Date  . Cholecystectomy    . Doppler echocardiography  March 2011; July 2014    Normal EF, essentially normal; no valvular lesions.  No PFO or ASD  . Treadmill myoview nuclear stress test  March 2011, August 2014    a) 2011: 6 minutes, 7 METS; Diaphragmatic attenuation otherwise no evidence of ischemia or infarction; b) 2014: Walked at 9 min, 10.1 METs, no ischemia or infarction with normal EF 66%. No EKG changes.  Jannett Celestine monitor  July 2014     Sinus rhythm, sinus tachycardia.  Shortness of breath, dizziness, and chest pain noted with sinus tachycardia  . Colonoscopy N/A 02/06/2014    SLF:  . Laparoscopic endometriosis fulguration  1990    Current Outpatient Prescriptions  Medication Sig Dispense Refill  . amLODipine (NORVASC) 10 MG tablet Take 5 mg by mouth every evening.    [name] Kitchen atorvastatin (LIPITOR) 20 MG tablet Take 20 mg by mouth daily at 6 PM.     . diphenhydrAMINE (BENADRYL) 25 MG tablet Take 25 mg by mouth every 6 (six) hours as needed.    [name] Kitchen  hydrochlorothiazide (HYDRODIURIL) 25 MG tablet Take 25 mg by mouth daily.    [name] Kitchen KLOR-CON M20 20 MEQ tablet Take 20 mEq by mouth daily.     . Linaclotide 290 MCG CAPS Take 290 mcg by mouth at bedtime.     . metoprolol succinate (TOPROL XL) 25 MG 24 hr tablet Take 1 tablet (25 mg total) by mouth daily. 30 tablet 11  . olmesartan (BENICAR) 40 MG tablet Take 40 mg by mouth every morning.      No current facility-administered medications for this visit.     Allergies as of 10/08/2014 - Review Complete 10/08/2014  Allergen Reaction Noted  . Doxycycline Itching and Rash     Family History  Problem Relation Age of Onset  . Hypertension Mother   . Hypertension Father   . Colon cancer Neg Hx   . Liver disease Neg Hx     History   Social History  . Marital Status: Married    Spouse Name: N/A  . Number of Children: 2  . Years of Education: College   Occupational History  . Customer service Other    Christian History Main Topics  . Smoking status: Never Smoker   . Smokeless tobacco: Never Used  . Alcohol Use: No  . Drug Use: No  . Sexual Activity: No   Other Topics Concern  . None   Social History Narrative   Remain since her last visit. Her husband and is Legrand Como.      Is now working on exercising regularly doing walks for 45 to 60 minutes a day 5 days a week.    Review of Systems: All negative except as per HPI.  Physical Exam: BP 120/78 mmHg  Pulse 86  Temp(Src) 97.3 F (36.3 C) (Oral)  Ht '5\' 6"'  (1.676 m)  Wt 191 lb 6.4 oz (86.818 kg)  BMI 30.91 kg/m2  LMP 09/23/2014 General:   Alert and oriented. No distress noted. Pleasant and cooperative.  Head:  Normocephalic and atraumatic. Eyes:  Conjuctiva clear without scleral icterus. Mouth:  Oral mucosa pink and moist. Good dentition. No lesions. Neck:  Supple, without mass or thyromegaly. Lungs:  Clear to auscultation bilaterally. No wheezes, rales, or rhonchi. No distress.  Heart:  S1, S2 present without murmurs, rubs, or gallops. Regular rate and rhythm. Abdomen:  +BS, soft, non-tender and non-distended. No rebound or guarding. No HSM or masses noted. No liver margin appreciated on physical exam. Msk:  Symmetrical without gross deformities. Normal posture. Pulses:  2+ DP noted bilaterally Extremities:  Without edema. Neurologic:  Alert and  oriented x4;  grossly normal neurologically. Skin:  Intact without significant lesions or rashes. Cervical  Nodes:  No significant cervical adenopathy. Psych:  Alert and cooperative. Normal mood and affect.    10/08/2014 8:43 AM

## 2014-10-12 ENCOUNTER — Ambulatory Visit (HOSPITAL_COMMUNITY)
Admission: RE | Admit: 2014-10-12 | Discharge: 2014-10-12 | Disposition: A | Discharge status: Home / Self Care | Payer: BLUE CROSS/BLUE SHIELD | Source: Ambulatory Visit | Attending: Nurse Practitioner | Admitting: Nurse Practitioner

## 2014-10-12 DIAGNOSIS — R7401 Elevation of levels of liver transaminase levels: Secondary | ICD-10-CM

## 2014-10-12 DIAGNOSIS — R748 Abnormal levels of other serum enzymes: Secondary | ICD-10-CM | POA: Diagnosis not present

## 2014-10-12 DIAGNOSIS — R740 Nonspecific elevation of levels of transaminase and lactic acid dehydrogenase [LDH]: Secondary | ICD-10-CM

## 2014-10-12 NOTE — Progress Notes (Signed)
Quick Note:  Pt does not have a phone number listed. Called her emergency contact [phone] and [facility]. ______

## 2014-10-13 ENCOUNTER — Telehealth: Payer: Self-pay | Admitting: Gastroenterology

## 2014-10-13 NOTE — Telephone Encounter (Signed)
LMOM to call.

## 2014-10-13 NOTE — Telephone Encounter (Signed)
PATIENT CALLED INQUIRING ABOUT TEST RESULTS

## 2014-10-13 NOTE — Progress Notes (Signed)
CC'ED TO PCP 

## 2014-10-14 NOTE — Progress Notes (Signed)
Quick Note:  Pt called and was informed of results. ______

## 2014-10-14 NOTE — Telephone Encounter (Signed)
PT called back and is aware of her results.

## 2014-10-29 ENCOUNTER — Ambulatory Visit: Payer: Self-pay | Admitting: Gastroenterology

## 2014-11-09 ENCOUNTER — Ambulatory Visit: Payer: BLUE CROSS/BLUE SHIELD | Admitting: Nurse Practitioner

## 2014-11-11 LAB — HEPATIC FUNCTION PANEL
ALBUMIN: 4.3 g/dL (ref 3.5–5.2)
ALT: 59 U/L — ABNORMAL HIGH (ref 0–35)
AST: 47 U/L — AB (ref 0–37)
Alkaline Phosphatase: 64 U/L (ref 39–117)
BILIRUBIN DIRECT: 0.1 mg/dL (ref 0.0–0.3)
BILIRUBIN INDIRECT: 0.4 mg/dL (ref 0.2–1.2)
TOTAL PROTEIN: 7.2 g/dL (ref 6.0–8.3)
Total Bilirubin: 0.5 mg/dL (ref 0.2–1.2)

## 2014-11-19 ENCOUNTER — Ambulatory Visit (INDEPENDENT_AMBULATORY_CARE_PROVIDER_SITE_OTHER): Payer: BLUE CROSS/BLUE SHIELD | Admitting: Nurse Practitioner

## 2014-11-19 ENCOUNTER — Other Ambulatory Visit: Payer: Self-pay

## 2014-11-19 ENCOUNTER — Encounter: Payer: Self-pay | Admitting: Nurse Practitioner

## 2014-11-19 VITALS — BP 115/79 | HR 83 | Temp 97.6°F | Ht 66.0 in | Wt 192.4 lb

## 2014-11-19 DIAGNOSIS — R1084 Generalized abdominal pain: Secondary | ICD-10-CM | POA: Diagnosis not present

## 2014-11-19 DIAGNOSIS — R740 Nonspecific elevation of levels of transaminase and lactic acid dehydrogenase [LDH]: Secondary | ICD-10-CM | POA: Diagnosis not present

## 2014-11-19 DIAGNOSIS — R109 Unspecified abdominal pain: Secondary | ICD-10-CM

## 2014-11-19 DIAGNOSIS — R7401 Elevation of levels of liver transaminase levels: Secondary | ICD-10-CM

## 2014-11-19 DIAGNOSIS — K5909 Other constipation: Secondary | ICD-10-CM | POA: Diagnosis not present

## 2014-11-19 NOTE — Progress Notes (Signed)
Referring Provider: Assunta FoundGolding, John, MD Primary Care Physician:  Colette RibasGOLDING, JOHN CABOT, MD Primary GI: Dr. Darrick PennaFields  Chief Complaint  Patient presents with  . Follow-up    HPI:   49 year old female presents for follow-up of transaminitis. Last seen in our office 10/08/2014 with trending upward AST/ALT with other liver function studies normal. CT of the abdomen was unremarkable, specifically the liver demonstrated no focal abnormality, no intrahepatic or extrahepatic biliary ductal dilation, gallbladder surgically absent. Likely causes deemed side effect of her statin therapy. At the time of her last visit her AST/ALT had increased to 104/134 has been holding the statin. Recheck of her labs 1 week after the last visit had AST/ALT at 47/59. Abdominal ultrasound ordered at the time of the last visit showed surgically absent gallbladder, otherwise unremarkable study.  Today she states she continues to have some cramping in her thighs and legs. Continues to have chronic abdominal pain. She has a bowel movement about once a week and requires laxative to go. Has tried Linzess 290 mg daily, MiraLAX and Dulcolax, as well as increased fiber and water. Denies any hematochezia, melena. Does admit some baseline dizziness and lightheadedness which is no worse than her normal. Denies fever, chills, change in appetite, unintentional weight loss, chest pain, shortness of breath. Denies jaundice or yellowing with the eyes, lower extremity edema, abdominal distention. Denies other upper or lower GI symptoms.  Past Medical History  Diagnosis Date  . Essential hypertension   . Hyperlipidemia LDL goal <130   . Thyroid disease     isolated, one month only, not on synthroid since 2013  . Stress incontinence   . Palpitations     Normal echocardiogram with no valvular lesions. Normal nuclear stress test in March 2011 and August 2014  . Obesity   . Constipation - functional   . Ectopic pregnancy     Past Surgical  History  Procedure Laterality Date  . Cholecystectomy    . Doppler echocardiography  March 2011; July 2014    Normal EF, essentially normal; no valvular lesions.  No PFO or ASD  . Treadmill myoview nuclear stress test  March 2011, August 2014    a) 2011: 6 minutes, 7 METS; Diaphragmatic attenuation otherwise no evidence of ischemia or infarction; b) 2014: Walked at 9 min, 10.1 METs, no ischemia or infarction with normal EF 66%. No EKG changes.  Shon Hough. Cardionet monitor  July 2014     Sinus rhythm, sinus tachycardia.  Shortness of breath, dizziness, and chest pain noted with sinus tachycardia  . Colonoscopy N/A 02/06/2014    SLF:1. Normal mucosa in the terrminal ileum 2. The LEFT colon is redundant 3. The colon mucosa was otherwise normal 4. Moderate sizxed internal hemorrhoids 5. No source for change in the bowel habits identified. constipation most likely due to functional constipation.   . Laparoscopic endometriosis fulguration  1990    Current Outpatient Prescriptions  Medication Sig Dispense Refill  . amLODipine (NORVASC) 10 MG tablet Take 5 mg by mouth every evening.    [name] Kitchen. atorvastatin (LIPITOR) 20 MG tablet Take 20 mg by mouth daily at 6 PM.     . diphenhydrAMINE (BENADRYL) 25 MG tablet Take 25 mg by mouth every 6 (six) hours as needed.    . hydrochlorothiazide (HYDRODIURIL) 25 MG tablet Take 25 mg by mouth daily.    [name] Kitchen. KLOR-CON M20 20 MEQ tablet Take 20 mEq by mouth daily.     . Linaclotide 290 MCG CAPS Take 290 mcg  by mouth at bedtime.     . metoprolol succinate (TOPROL XL) 25 MG 24 hr tablet Take 1 tablet (25 mg total) by mouth daily. 30 tablet 11  . olmesartan (BENICAR) 40 MG tablet Take 40 mg by mouth every morning.      No current facility-administered medications for this visit.    Allergies as of 11/19/2014 - Review Complete 11/19/2014  Allergen Reaction Noted  . Doxycycline Itching and Rash     Family History  Problem Relation Age of Onset  . Hypertension Mother   .  Hypertension Father   . Colon cancer Neg Hx   . Liver disease Neg Hx     History   Social History  . Marital Status: Married    Spouse Name: N/A  . Number of Children: 2  . Years of Education: College   Occupational History  . Customer service Other    [name]   Social History Main Topics  . Smoking status: Never Smoker   . Smokeless tobacco: Never Used  . Alcohol Use: No  . Drug Use: No  . Sexual Activity: No   Other Topics Concern  . None   Social History Narrative   Remain since her last visit. Her husband and is [name].      Is now working on exercising regularly doing walks for 45 to 60 minutes a day 5 days a week.    Review of Systems: 10 point ROS negative except as per HPI.  Physical Exam: BP 115/79 mmHg  Pulse 83  Temp(Src) 97.6 F (36.4 C) (Oral)  Ht  (1.676 m)  Wt 192 lb 6.4 oz (87.272 kg)  BMI 31.07 kg/m2  LMP 11/17/2014 General:   Alert and oriented. No distress noted. Pleasant and cooperative.  Head:  Normocephalic and atraumatic. Eyes:  Conjuctiva clear without scleral icterus. Mouth:  Oral mucosa pink and moist. Good dentition. No lesions. Neck:  Supple, without mass or thyromegaly. Lungs:  Clear to auscultation bilaterally. No wheezes, rales, or rhonchi. No distress.  Heart:  S1, S2 present without murmurs, rubs, or gallops. Regular rate and rhythm. Abdomen:  +BS, soft, non-tender and non-distended. No rebound or guarding. No HSM or masses noted. Msk:  Symmetrical without gross deformities. Normal posture. Extremities:  Without edema. Neurologic:  Alert and  oriented x4;  grossly normal neurologically. Skin:  Intact without significant lesions or rashes.No jaundice noted. Psych:  Alert and cooperative. Normal mood and affect.    11/19/2014 9:15 AM

## 2014-11-19 NOTE — Patient Instructions (Signed)
1. Continue not taking your statin medication 2. We will check your labs in 1 month 3. Follow-up with Dr. Darrick PennaFields in 3 months for chronic constipation issues 4. We will forward our notes to Dr. Phillips OdorGolding. Call their office to arrange a follow-up to start a different cholesterol medication

## 2014-11-25 NOTE — Assessment & Plan Note (Signed)
Elevated AST/ALT is currently trending down with last IUs of 47/59. Other hepatic function labs have consistently been within normal limits. Abdominal ultrasound ordered at the last visit showed surgically absent gallbladder otherwise unremarkable study. Denies any red flag or warning signs or symptoms of worsening liver disease or concerns liver changes. We'll recheck hepatic function panel in 1 month. Transaminitis likely due to statin medication. Recommend patient follow PCP to start a non-statin cholesterol medication. Return for follow-up in 3 months, or sooner if any change in symptoms or worsening lab values.

## 2014-11-25 NOTE — Assessment & Plan Note (Addendum)
Patient with a history of abdominal pain likely related to her chronic constipation. Currently on process 290 g daily, MiraLAX, Dulcolax. Increase fiber and water intake. Return for 3 month follow-up to further evaluate her symptoms or sooner if symptoms worsen. No current of flag/warning signs or symptoms.

## 2014-11-25 NOTE — Assessment & Plan Note (Signed)
Patient with a history of chronic constipation. Is currently on MiraLAX, and Linzess 290 g daily. Commend increasing fiber and water intake. While patient return for follow-up in 3 months to reevaluate constipation symptoms. She can call us to return sooner if symptoms worsen. No red flag or warning signs or symptoms.

## 2014-11-26 NOTE — Progress Notes (Signed)
cc'ed to pcp °

## 2014-12-08 ENCOUNTER — Other Ambulatory Visit: Payer: Self-pay

## 2014-12-08 DIAGNOSIS — R740 Nonspecific elevation of levels of transaminase and lactic acid dehydrogenase [LDH]: Principal | ICD-10-CM

## 2014-12-08 DIAGNOSIS — R109 Unspecified abdominal pain: Secondary | ICD-10-CM

## 2014-12-08 DIAGNOSIS — R7401 Elevation of levels of liver transaminase levels: Secondary | ICD-10-CM

## 2015-01-13 LAB — HEPATIC FUNCTION PANEL
ALT: 23 U/L (ref 0–35)
AST: 25 U/L (ref 0–37)
Albumin: 3.9 g/dL (ref 3.5–5.2)
Alkaline Phosphatase: 55 U/L (ref 39–117)
BILIRUBIN DIRECT: 0.1 mg/dL (ref 0.0–0.3)
Indirect Bilirubin: 0.3 mg/dL (ref 0.2–1.2)
Total Bilirubin: 0.4 mg/dL (ref 0.2–1.2)
Total Protein: 6.4 g/dL (ref 6.0–8.3)

## 2015-01-25 ENCOUNTER — Encounter: Payer: Self-pay | Admitting: Gastroenterology

## 2015-02-25 ENCOUNTER — Ambulatory Visit: Payer: BLUE CROSS/BLUE SHIELD | Admitting: Gastroenterology

## 2015-03-23 ENCOUNTER — Other Ambulatory Visit: Payer: Self-pay | Admitting: Cardiology

## 2015-03-23 NOTE — Telephone Encounter (Signed)
REFILL 

## 2015-04-05 ENCOUNTER — Other Ambulatory Visit: Payer: Self-pay | Admitting: Cardiology

## 2015-04-05 NOTE — Telephone Encounter (Signed)
Rx(s) sent to pharmacy electronically.  

## 2015-04-06 ENCOUNTER — Other Ambulatory Visit: Payer: Self-pay | Admitting: Cardiology

## 2015-04-07 NOTE — Telephone Encounter (Signed)
Rx request sent to pharmacy.  

## 2015-04-12 ENCOUNTER — Other Ambulatory Visit: Payer: Self-pay | Admitting: Cardiology

## 2015-04-12 NOTE — Telephone Encounter (Signed)
REFILL 

## 2015-04-12 NOTE — Telephone Encounter (Signed)
Patient has an appointment on 05/10/15 with Dr. Herbie Baltimore

## 2015-04-12 NOTE — Telephone Encounter (Signed)
°  1. Which medications need to be refilled? Metoprolol  2. Which pharmacy is medication to be sent to?[facility] in [place]   3. Do they need a 30 day or 90 day supply? Not specified   4. Would they like a call back once the medication has been sent to the pharmacy? Yes

## 2015-05-10 ENCOUNTER — Ambulatory Visit: Payer: BLUE CROSS/BLUE SHIELD | Admitting: Cardiology

## 2015-05-11 ENCOUNTER — Other Ambulatory Visit: Payer: Self-pay | Admitting: Cardiology

## 2015-05-12 ENCOUNTER — Ambulatory Visit (INDEPENDENT_AMBULATORY_CARE_PROVIDER_SITE_OTHER): Payer: BLUE CROSS/BLUE SHIELD | Admitting: Cardiology

## 2015-05-12 VITALS — BP 140/86 | HR 99 | Ht 66.0 in | Wt 198.7 lb

## 2015-05-12 DIAGNOSIS — E669 Obesity, unspecified: Secondary | ICD-10-CM | POA: Diagnosis not present

## 2015-05-12 DIAGNOSIS — I10 Essential (primary) hypertension: Secondary | ICD-10-CM | POA: Diagnosis not present

## 2015-05-12 DIAGNOSIS — R011 Cardiac murmur, unspecified: Secondary | ICD-10-CM | POA: Diagnosis not present

## 2015-05-12 DIAGNOSIS — M7989 Other specified soft tissue disorders: Secondary | ICD-10-CM

## 2015-05-12 DIAGNOSIS — R002 Palpitations: Secondary | ICD-10-CM | POA: Diagnosis not present

## 2015-05-12 DIAGNOSIS — E785 Hyperlipidemia, unspecified: Secondary | ICD-10-CM

## 2015-05-12 DIAGNOSIS — R609 Edema, unspecified: Secondary | ICD-10-CM

## 2015-05-12 MED ORDER — HYDROCHLOROTHIAZIDE 12.5 MG PO CAPS: ORAL_CAPSULE | ORAL | Status: DC

## 2015-05-12 MED ORDER — METOPROLOL SUCCINATE ER 50 MG PO TB24: 50.0000 mg | ORAL_TABLET | Freq: Every day | ORAL | Status: DC

## 2015-05-12 NOTE — Progress Notes (Signed)
PCP: Colette Ribas, MD  Clinic Note: Chief Complaint  Patient presents with  . Annual Exam    pt states chest pain sometimes/ sharp pain at night, can last 30 min. to 1 hr. feels achy//cramping in loth legs and back  . Shortness of Breath    on exertion and at rest  . Dizziness    when going from sitting to standing//also feels nauseated   . Edema    bilateral legs/feet/ankles/hands  . Headache    lasts all day, and never goes away//takes advil which eases pain     HPI: [name] is a 49 y.o. female with a PMH below who presents today for annual follow-up of hypertension, palpitations and dyslipidemia for cardiac risk factor modification.  [name] was last seen on 03/30/2014. She is doing relatively well at that time stating that her palpitations were notably improved. She did note some exertional dyspnea but was doing well. Overall was tolerating her medications without any major problems.  Recent Hospitalizations: No recent hospitalizations, but has seen several different specialists and her PCP for muscle aches myalgias/fatigue  Studies Reviewed: She brought with her labs from last year. Unfortunately none of the abnormal labs were noted.  Interval History: She presents today really with a lot of noncardiac issues to discuss. She has not been doing very well since about January, where she started noticing having significant myalgias and muscle cramping spells. Her episodes of the residual symptoms abated very difficult for her to walk. She is put on a lot of weight. She has seen her primary care doctor amending occurrences. They thought initially her symptoms are related to an examination is so her statin was stopped. She saw a rheumatologist who stopped her HCTZ , but was unsure as what her symptoms were caused by end recently told her to come back as needed. She saw another rheumatologist, but did not have much to offer either.  She basically has  been very sedentary since this all started. Not really getting much in the way of routine exercise. She is started noting some exertional dyspnea not associated with any chest tightness or pressure. She is easily fatigued. She does have some palpitations but nothing overly concerning. Nothing to suggest a rate or rhythm. She says that since I stopped her HCTZ she feels tight and swollen in her hands and feet. She has been having frequent daily headaches. In addition to her extremity myalgias, she also notes some chest discomfort spells that can last up to 30 minutes. These are usually occurring at night and not with exertion. She notes some orthostatic sounding dizziness, but also notes some blurred vision and headaches as well. No TIA or amaurosis fugax symptoms. No syncope or near syncope.  No TIA/amaurosis fugax symptoms. No melena, hematochezia, hematuria, or epstaxis. No claudication.  ROS: A comprehensive was performed. Review of Systems  Constitutional: Positive for malaise/fatigue.       Concerned about weight gain with lack of activity  Cardiovascular: Positive for chest pain, palpitations and leg swelling.  Musculoskeletal: Positive for myalgias.  Neurological: Positive for dizziness.  Endo/Heme/Allergies: Does not bruise/bleed easily.  Psychiatric/Behavioral: The patient is nervous/anxious (Getting frustrated by her symptoms.).   All other systems reviewed and are negative.   Past Medical History  Diagnosis Date  . Essential hypertension   . Hyperlipidemia LDL goal <130   . Thyroid disease     isolated, one month only, not on synthroid since 2013  . Stress  incontinence   . Palpitations     Normal echocardiogram with no valvular lesions. Normal nuclear stress test in March 2011 and August 2014  . Obesity   . Constipation - functional   . Ectopic pregnancy    Past Surgical History  Procedure Laterality Date  . Cholecystectomy    . Doppler echocardiography  March 2011;  July 2014    Normal EF, essentially normal; no valvular lesions.  No PFO or ASD  . Treadmill myoview nuclear stress test  March 2011, August 2014    a) 2011: 6 minutes, 7 METS; Diaphragmatic attenuation otherwise no evidence of ischemia or infarction; b) 2014: Walked at 9 min, 10.1 METs, no ischemia or infarction with normal EF 66%. No EKG changes.  Shon Hough monitor  July 2014     Sinus rhythm, sinus tachycardia.  Shortness of breath, dizziness, and chest pain noted with sinus tachycardia  . Colonoscopy N/A 02/06/2014    SLF:1. Normal mucosa in the terrminal ileum 2. The LEFT colon is redundant 3. The colon mucosa was otherwise normal 4. Moderate sizxed internal hemorrhoids 5. No source for change in the bowel habits identified. constipation most likely due to functional constipation.   . Laparoscopic endometriosis fulguration  1990    Prior to Admission medications   Medication Sig Start Date End Date Taking? Authorizing Provider  amLODipine (NORVASC) 10 MG tablet Take 5 mg by mouth every evening. 01/21/13  Yes Lorre Nick, MD  cyclobenzaprine (FLEXERIL) 10 MG tablet Take 10 mg by mouth 3 (three) times daily as needed. 05/04/15  Yes Historical Provider, MD  diphenhydrAMINE (BENADRYL) 25 MG tablet Take 25 mg by mouth every 6 (six) hours as needed.   Yes Historical Provider, MD  KLOR-CON M20 20 MEQ tablet Take 20 mEq by mouth daily.  12/13/13  Yes Historical Provider, MD  Linaclotide 290 MCG CAPS Take 290 mcg by mouth at bedtime.    Yes Historical Provider, MD  metoprolol succinate (TOPROL-XL) 25 MG 24 hr tablet Take 1 tablet (25 mg total) by mouth daily. KEEP OV. 04/12/15  Yes Marykay Lex, MD  olmesartan (BENICAR) 40 MG tablet Take 40 mg by mouth every morning.    Yes Historical Provider, MD   Allergies  Allergen Reactions  . Doxycycline Itching and Rash    Social History   Social History  . Marital Status: Married    Spouse Name: N/A  . Number of Children: 2  . Years of Education:  College   Occupational History  . Customer service Other    [name]   Social History Main Topics  . Smoking status: Never Smoker   . Smokeless tobacco: Never Used  . Alcohol Use: No  . Drug Use: No  . Sexual Activity: No   Other Topics Concern  . None   Social History Narrative   Remain since her last visit. Her husband and is [name].      Is now working on exercising regularly doing walks for 45 to 60 minutes a day 5 days a week.   Family History  Problem Relation Age of Onset  . Hypertension Mother   . Hypertension Father   . Colon cancer Neg Hx   . Liver disease Neg Hx      Wt Readings from Last 3 Encounters:  05/12/15 198 lb 11.2 oz (90.13 kg)  11/19/14 192 lb 6.4 oz (87.272 kg)  10/08/14 191 lb 6.4 oz (86.818 kg)    PHYSICAL EXAM BP 140/86 mmHg  Pulse 99  Ht 5\' 6"  (1.676 m)  Wt 198 lb 11.2 oz (90.13 kg)  BMI 32.09 kg/m2 General appearance: alert, cooperative, no distress and Pleasant mood and affect.  Neck: no carotid bruit and no JVD  Lungs: clear to auscultation bilaterally, normal percussion bilaterally and Nonlabored, good air movement.  Heart: normal apical impulse, regular rate and rhythm, S1, S2 normal, no S3 or S4 and systolic murmur: early systolic 1/6, low pitch and harsh at lower left sternal border  Abdomen: soft, non-tender; bowel sounds normal; no masses, no organomegaly  Extremities: extremities normal, atraumatic, no cyanosis or edema, no edema; No redness or tenderness in the calves or thighs and no ulcers; Pulses: 2+ and symmetric  Neurologic: A&O X 3, normal strength and tone. Normal symmetric reflexes. Normal coordination and gait   Adult ECG Report  Rate: 99 ;  Rhythm: normal sinus rhythm and Borderline sinus tachycardia. Otherwise normal axis, intervals and durations.;   Narrative Interpretation: Normal EKG   Other studies Reviewed: Additional studies/ records that were reviewed today include:  Recent Labs:  No labs since  07/2014 available    ASSESSMENT / PLAN: Problem List Items Addressed This Visit    Swelling of both lower and upper extremities (Chronic)    Not exactly sure what's causing this, but it is noted to be worse after stopping her HCTZ. I think using HCTZ a couple times a week up to 3 times a week would be acceptable. I'll prescribe her a lower dose of 12.5 mg that she uses when necessary.      Palpitations (Chronic)    Somewhat more notable over the last few months. It may because of her pain levels. Increase Toprol to 50 mg daily. No evidence of chronotropic incompetence.      Relevant Orders   EKG 12-Lead (Completed)   Obesity (BMI 30-39.9) (Chronic)    Unfortunately, with her significant cramping, she has not been able to exercise and therefore is probably put on some weight. I did talk to her about the importance of at least monitoring her diet.      Murmur (Chronic)   Relevant Orders   EKG 12-Lead (Completed)   Hyperlipidemia with target LDL less than 130 (Chronic)    Continues to be monitored by her PCP. I don't have recent labs to go by. She was on a statin that was stopped due to cramping. Her statin was stopped over 6 months ago, and she continues to have cramping symptoms. This would suggest that her symptoms were not related to statin. However until the episodes of cramping have resolved, I would be reluctant to restart any type of treatment besides Zetia or Lovaza. As I'm not checking her labs, would defer to PCP.      Relevant Medications   metoprolol succinate (TOPROL-XL) 50 MG 24 hr tablet   hydrochlorothiazide (MICROZIDE) 12.5 MG capsule   Other Relevant Orders   EKG 12-Lead (Completed)   Essential hypertension - Primary (Chronic)    Not as well controlled now that she is off HCTZ. With sinus tachycardia, I will increase her Toprol to 50 mg.      Relevant Medications   metoprolol succinate (TOPROL-XL) 50 MG 24 hr tablet   hydrochlorothiazide (MICROZIDE) 12.5 MG  capsule   Other Relevant Orders   EKG 12-Lead (Completed)    Other Visit Diagnoses    Edema        Relevant Orders    EKG 12-Lead (Completed)  Current medicines are reviewed at length with the patient today. (+/- concerns) she's been noticing swelling since stopping her HCTZ The following changes have been made:  Okay to take HCTZ 12.5mg  2- 3 days a week when necessary edema.  INCREASE METOPROLOL SUCC. (XL) 50 MG  TAKE ONE TABLET DAILY   Your physician wants you to follow-up in 12 MONTHS WITH DR HARDING.  Studies Ordered:   Orders Placed This Encounter  Procedures  . EKG 12-Lead      Marykay Lex, M.D., M.S. Interventional Cardiologist   Pager # [phone]

## 2015-05-12 NOTE — Patient Instructions (Signed)
INCREASE METOPROLOL SUCC. (XL) 50 MG  TAKE ONE TABLET DAILY.  TAKE HCTZ 12.5 MG  2-3  TIMES  WEEKLY FOR SWELLING .   Your physician wants you to follow-up in 12 MONTHS WITH DR HARDING. You will receive a reminder letter in the mail two months in advance. If you don't receive a letter, please call our office to schedule the follow-up appointment.

## 2015-05-13 ENCOUNTER — Encounter: Payer: Self-pay | Admitting: Cardiology

## 2015-05-13 DIAGNOSIS — M7989 Other specified soft tissue disorders: Secondary | ICD-10-CM | POA: Insufficient documentation

## 2015-05-13 NOTE — Assessment & Plan Note (Signed)
Not exactly sure what's causing this, but it is noted to be worse after stopping her HCTZ. I think using HCTZ a couple times a week up to 3 times a week would be acceptable. I'll prescribe her a lower dose of 12.5 mg that she uses when necessary.

## 2015-05-13 NOTE — Assessment & Plan Note (Signed)
Continues to be monitored by her PCP. I don't have recent labs to go by. She was on a statin that was stopped due to cramping. Her statin was stopped over 6 months ago, and she continues to have cramping symptoms. This would suggest that her symptoms were not related to statin. However until the episodes of cramping have resolved, I would be reluctant to restart any type of treatment besides Zetia or Lovaza. As I'm not checking her labs, would defer to PCP.

## 2015-05-13 NOTE — Assessment & Plan Note (Signed)
Somewhat more notable over the last few months. It may because of her pain levels. Increase Toprol to 50 mg daily. No evidence of chronotropic incompetence.

## 2015-05-13 NOTE — Assessment & Plan Note (Signed)
Unfortunately, with her significant cramping, she has not been able to exercise and therefore is probably put on some weight. I did talk to her about the importance of at least monitoring her diet.

## 2015-05-13 NOTE — Assessment & Plan Note (Signed)
Not as well controlled now that she is off HCTZ. With sinus tachycardia, I will increase her Toprol to 50 mg.

## 2015-05-26 ENCOUNTER — Encounter: Payer: Self-pay | Admitting: Cardiology

## 2015-06-24 ENCOUNTER — Encounter: Payer: Self-pay | Admitting: Podiatry

## 2015-06-24 ENCOUNTER — Ambulatory Visit (INDEPENDENT_AMBULATORY_CARE_PROVIDER_SITE_OTHER): Payer: BLUE CROSS/BLUE SHIELD | Admitting: Podiatry

## 2015-06-24 ENCOUNTER — Ambulatory Visit: Payer: BLUE CROSS/BLUE SHIELD

## 2015-06-24 VITALS — BP 143/90 | HR 66 | Resp 12

## 2015-06-24 DIAGNOSIS — M722 Plantar fascial fibromatosis: Secondary | ICD-10-CM

## 2015-06-24 DIAGNOSIS — M2040 Other hammer toe(s) (acquired), unspecified foot: Secondary | ICD-10-CM

## 2015-06-24 DIAGNOSIS — M216X9 Other acquired deformities of unspecified foot: Secondary | ICD-10-CM

## 2015-06-24 NOTE — Progress Notes (Signed)
   Subjective:    Patient ID: [name], female    DOB: 05/21/1966, 49 y.o.   MRN: [id number]  HPI  PT STATED RT FOOT 2ND TOE AND BALL OF THE FOOT RT, LT 2,3,4TH TOE BEEN PAINFUL FOR 2 YEARS. TOES ARE GETTING WORSE ESPECIALLY WHEN WEARING SHOES-TIRED NO TREATMENT.  Review of Systems     Objective:   Physical Exam        Assessment & Plan:

## 2015-06-27 NOTE — Progress Notes (Signed)
Subjective:     Patient ID: [name], female   DOB: 07/26/1966, 49 y.o.   MRN: [id number]  HPI patient states that she's been getting a lot of pain in her feet over the last couple years with worsening over the last 6 months. Right foot hurts more than left and the hammertoes are becoming increasingly rigid and making it increasingly hard to walk and she is also complaining of heel pain on both feet   Review of Systems  All other systems reviewed and are negative.      Objective:   Physical Exam  Constitutional: She is oriented to person, place, and time.  Cardiovascular: Intact distal pulses.   Musculoskeletal: Normal range of motion.  Neurological: She is oriented to person, place, and time.  Skin: Skin is warm.  Nursing note and vitals reviewed.  neurovascular status intact muscle strength adequate range of motion was within normal limits with patient noted to have rigidly contracted lesser digits right over left with dorsal keratotic lesions that are painful and exquisite discomfort second metatarsophalangeal joint right over left. Patient's also noted to have pain in the heel region of both feet. Good digital perfusion is noted and well oriented 3     Assessment:     Hammertoe deformity with inflammation and pain around the lesser digits along with metatarsal phalangeal joint inflammation second MPJ and plantar fasciitis bilateral    Plan:     H&P and all conditions and x-rays reviewed with patient. At this point digital fusion of the lesser digits along with shortening osteotomy second metatarsal right has been recommended with consideration for injections of the heel at that time. Patient wants to pursue this course and I educated her on this and she is scheduled for consult in 1 week to discuss in greater detail

## 2015-09-23 ENCOUNTER — Encounter: Payer: Self-pay | Admitting: Podiatry

## 2015-09-23 ENCOUNTER — Ambulatory Visit (INDEPENDENT_AMBULATORY_CARE_PROVIDER_SITE_OTHER): Payer: BLUE CROSS/BLUE SHIELD | Admitting: Podiatry

## 2015-09-23 VITALS — BP 135/90 | HR 69 | Resp 16

## 2015-09-23 DIAGNOSIS — M216X9 Other acquired deformities of unspecified foot: Secondary | ICD-10-CM | POA: Diagnosis not present

## 2015-09-23 DIAGNOSIS — M722 Plantar fascial fibromatosis: Secondary | ICD-10-CM | POA: Diagnosis not present

## 2015-09-23 DIAGNOSIS — M2040 Other hammer toe(s) (acquired), unspecified foot: Secondary | ICD-10-CM | POA: Diagnosis not present

## 2015-09-23 NOTE — Patient Instructions (Addendum)
Plantar Fasciitis (Heel Spur Syndrome) with Rehab The plantar fascia is a fibrous, ligament-like, soft-tissue structure that spans the bottom of the foot. Plantar fasciitis is a condition that causes pain in the foot due to inflammation of the tissue. SYMPTOMS  1. Pain and tenderness on the underneath side of the foot. 2. Pain that worsens with standing or walking. CAUSES  Plantar fasciitis is caused by irritation and injury to the plantar fascia on the underneath side of the foot. Common mechanisms of injury include:  Direct trauma to bottom of the foot.  Damage to a small nerve that runs under the foot where the main fascia attaches to the heel bone.  Stress placed on the plantar fascia due to bone spurs. RISK INCREASES WITH:   Activities that place stress on the plantar fascia (running, jumping, pivoting, or cutting).  Poor strength and flexibility.  Improperly fitted shoes.  Tight calf muscles.  Flat feet.  Failure to warm-up properly before activity.  Obesity. PREVENTION  Warm up and stretch properly before activity.  Allow for adequate recovery between workouts.  Maintain physical fitness:  Strength, flexibility, and endurance.  Cardiovascular fitness.  Maintain a health body weight.  Avoid stress on the plantar fascia.  Wear properly fitted shoes, including arch supports for individuals who have flat feet.  PROGNOSIS  If treated properly, then the symptoms of plantar fasciitis usually resolve without surgery. However, occasionally surgery is necessary.  RELATED COMPLICATIONS   Recurrent symptoms that may result in a chronic condition.  Problems of the lower back that are caused by compensating for the injury, such as limping.  Pain or weakness of the foot during push-off following surgery.  Chronic inflammation, scarring, and partial or complete fascia tear, occurring more often from repeated injections.  TREATMENT  Treatment initially involves the  use of ice and medication to help reduce pain and inflammation. The use of strengthening and stretching exercises may help reduce pain with activity, especially stretches of the Achilles tendon. These exercises may be performed at home or with a therapist. Your caregiver may recommend that you use heel cups of arch supports to help reduce stress on the plantar fascia. Occasionally, corticosteroid injections are given to reduce inflammation. If symptoms persist for greater than 6 months despite non-surgical (conservative), then surgery may be recommended.   MEDICATION   If pain medication is necessary, then nonsteroidal anti-inflammatory medications, such as aspirin and ibuprofen, or other minor pain relievers, such as acetaminophen, are often recommended.  Do not take pain medication within 7 days before surgery.  Prescription pain relievers may be given if deemed necessary by your caregiver. Use only as directed and only as much as you need.  Corticosteroid injections may be given by your caregiver. These injections should be reserved for the most serious cases, because they may only be given a certain number of times.  HEAT AND COLD  Cold treatment (icing) relieves pain and reduces inflammation. Cold treatment should be applied for 10 to 15 minutes every 2 to 3 hours for inflammation and pain and immediately after any activity that aggravates your symptoms. Use ice packs or massage the area with a piece of ice (ice massage).  Heat treatment may be used prior to performing the stretching and strengthening activities prescribed by your caregiver, physical therapist, or athletic trainer. Use a heat pack or soak the injury in warm water.  SEEK IMMEDIATE MEDICAL CARE IF:  Treatment seems to offer no benefit, or the condition worsens.  Any medications  produce adverse side effects.  EXERCISES- RANGE OF MOTION (ROM) AND STRETCHING EXERCISES - Plantar Fasciitis (Heel Spur Syndrome) These exercises  may help you when beginning to rehabilitate your injury. Your symptoms may resolve with or without further involvement from your physician, physical therapist or athletic trainer. While completing these exercises, remember:   Restoring tissue flexibility helps normal motion to return to the joints. This allows healthier, less painful movement and activity.  An effective stretch should be held for at least 30 seconds.  A stretch should never be painful. You should only feel a gentle lengthening or release in the stretched tissue.  RANGE OF MOTION - Toe Extension, Flexion  Sit with your right / left leg crossed over your opposite knee.  Grasp your toes and gently pull them back toward the top of your foot. You should feel a stretch on the bottom of your toes and/or foot.  Hold this stretch for 10 seconds.  Now, gently pull your toes toward the bottom of your foot. You should feel a stretch on the top of your toes and or foot.  Hold this stretch for 10 seconds. Repeat  times. Complete this stretch 3 times per day.   RANGE OF MOTION - Ankle Dorsiflexion, Active Assisted  Remove shoes and sit on a chair that is preferably not on a carpeted surface.  Place right / left foot under knee. Extend your opposite leg for support.  Keeping your heel down, slide your right / left foot back toward the chair until you feel a stretch at your ankle or calf. If you do not feel a stretch, slide your bottom forward to the edge of the chair, while still keeping your heel down.  Hold this stretch for 10 seconds. Repeat 3 times. Complete this stretch 2 times per day.   STRETCH  Gastroc, Standing  Place hands on wall.  Extend right / left leg, keeping the front knee somewhat bent.  Slightly point your toes inward on your back foot.  Keeping your right / left heel on the floor and your knee straight, shift your weight toward the wall, not allowing your back to arch.  You should feel a gentle stretch  in the right / left calf. Hold this position for 10 seconds. Repeat 3 times. Complete this stretch 2 times per day.  STRETCH  Soleus, Standing  Place hands on wall.  Extend right / left leg, keeping the other knee somewhat bent.  Slightly point your toes inward on your back foot.  Keep your right / left heel on the floor, bend your back knee, and slightly shift your weight over the back leg so that you feel a gentle stretch deep in your back calf.  Hold this position for 10 seconds. Repeat 3 times. Complete this stretch 2 times per day.  STRETCH  Gastrocsoleus, Standing  Note: This exercise can place a lot of stress on your foot and ankle. Please complete this exercise only if specifically instructed by your caregiver.   Place the ball of your right / left foot on a step, keeping your other foot firmly on the same step.  Hold on to the wall or a rail for balance.  Slowly lift your other foot, allowing your body weight to press your heel down over the edge of the step.  You should feel a stretch in your right / left calf.  Hold this position for 10 seconds.  Repeat this exercise with a slight bend in your right /   left knee. Repeat 3 times. Complete this stretch 2 times per day.   STRENGTHENING EXERCISES - Plantar Fasciitis (Heel Spur Syndrome)  These exercises may help you when beginning to rehabilitate your injury. They may resolve your symptoms with or without further involvement from your physician, physical therapist or athletic trainer. While completing these exercises, remember:   Muscles can gain both the endurance and the strength needed for everyday activities through controlled exercises.  Complete these exercises as instructed by your physician, physical therapist or athletic trainer. Progress the resistance and repetitions only as guided.  STRENGTH - Towel Curls  Sit in a chair positioned on a non-carpeted surface.  Place your foot on a towel, keeping your heel  on the floor.  Pull the towel toward your heel by only curling your toes. Keep your heel on the floor. Repeat 3 times. Complete this exercise 2 times per day.  STRENGTH - Ankle Inversion  Secure one end of a rubber exercise band/tubing to a fixed object (table, pole). Loop the other end around your foot just before your toes.  Place your fists between your knees. This will focus your strengthening at your ankle.  Slowly, pull your big toe up and in, making sure the band/tubing is positioned to resist the entire motion.  Hold this position for 10 seconds.  Have your muscles resist the band/tubing as it slowly pulls your foot back to the starting position. Repeat 3 times. Complete this exercises 2 times per day.  Document Released: 07/31/2005 Document Revised: 10/23/2011 Document Reviewed: 11/12/2008 Exit[employer] Patient Information 2014 [employer] Pre-Operative Instructions  Congratulations, you have decided to take an important step to improving your quality of life.  You can be assured that the doctors of [facility] will be with you every step of the way.  3. Plan to be at the surgery center/hospital at least 1 (one) hour prior to your scheduled time unless otherwise directed by the surgical center/hospital staff.  You must have a responsible adult accompany you, remain during the surgery and drive you home.  Make sure you have directions to the surgical center/hospital and know how to get there on time. 4. For hospital based surgery you will need to obtain a history and physical form from your family physician within 1 month prior to the date of surgery- we will give you a form for you primary physician.  5. We make every effort to accommodate the date you request for surgery.  There are however, times where surgery dates or times have to be moved.  We will contact you as soon as possible if a change in schedule is required.   6. No Aspirin/Ibuprofen for one week before surgery.   If you are on aspirin, any non-steroidal anti-inflammatory medications (Mobic, Aleve, Ibuprofen) you should stop taking it 7 days prior to your surgery.  You make take Tylenol  For pain prior to surgery.  7. Medications- If you are taking daily heart and blood pressure medications, seizure, reflux, allergy, asthma, anxiety, pain or diabetes medications, make sure the surgery center/hospital is aware before the day of surgery so they may notify you which medications to take or avoid the day of surgery. 8. No food or drink after midnight the night before surgery unless directed otherwise by surgical center/hospital staff. 9. No alcoholic beverages 24 hours prior to surgery.  No smoking 24 hours prior to or 24 hours after surgery. 10. Wear loose pants or shorts- loose enough to fit over bandages,   over bandages, boots, and casts. 11. No slip on shoes, sneakers are best. 12. Bring your boot with you to the surgery center/hospital.  Also bring crutches or a walker if your physician has prescribed it for you.  If you do not have this equipment, it will be provided for you after surgery. 13. If you have not been contracted by the surgery center/hospital by the day before your surgery, call to confirm the date and time of your surgery. 14. Leave-time from work may vary depending on the type of surgery you have.  Appropriate arrangements should be made prior to surgery with your employer. 15. Prescriptions will be provided immediately following surgery by your doctor.  Have these filled as soon as possible after surgery and take the medication as directed. 16. Remove nail polish on the operative foot. 17. Wash the night before surgery.  The night before surgery wash the foot and leg well with the antibacterial soap provided and water paying special attention to beneath the toenails and in between the toes.  Rinse thoroughly with water and dry well with a towel.  Perform this wash unless told not to do so by your  physician.  Enclosed: 1 Ice pack (please put in freezer the night before surgery)   1 Hibiclens skin cleaner   Pre-op Instructions  If you have any questions regarding the instructions, do not hesitate to call our office.  [place]: [address] [place], [place] [address] [phone]  [place]: [address], [place], [place] [address] [phone]  [place]: [address], [place] [address] [phone]  Dr. Santiago Bumpers DPM, Dr. Cristie Hem DPM Dr. Alvan Dame DPM, Dr. Ardelle Anton DPM, Dr. Marlowe Aschoff DPM

## 2015-09-23 NOTE — Progress Notes (Signed)
Subjective:     Patient ID: [name], female   DOB: 1965-10-10, 50 y.o.   MRN: [id number]  HPI patient presents with husband stating that she's ready to get this right foot fixed and the toes are very sore and it hurts underneath and her heels on both feet hurt   Review of Systems     Objective:   Physical Exam Neurovascular status intact muscle strength adequate with keratotic lesion subsecond metatarsal right that's very tender with digital deformities digits 2345 right and left foot and discomfort in the plantar heel of both feet    Assessment:     Hammertoe deformity digits 234 and 5 right over left along with keratotic lesions and plantarflexed second metatarsal bilateral and plantar fasciitis    Plan:     Reviewed all conditions and discussed treatment options. Patient wants surgery and I allowed her to read consent form discussing alternative treatments and complications associated with surgery and reviewed digital fusion digit 2 right arthroplasty digits 345 right and elevating osteotomy second metatarsal right along with plantar fascial injections bilateral. I explained the procedures and reviewed all risk associated with them and patient understands this completely and wants surgery understanding recovery of a proximally 6 months with no long-term guarantees and the fact lesions can recur. Patient signed consent form and is given a air fracture walker today with all instructions on usage and is encouraged to call with any questions prior to procedure which is scheduled today

## 2015-09-24 ENCOUNTER — Telehealth: Payer: Self-pay | Admitting: *Deleted

## 2015-09-24 NOTE — Telephone Encounter (Signed)
"  I was in the office on yesterday and scheduled a surgery appointment with Dr. Charlsie Merles for March 14.  Because of my work schedule I need to reschedule that to the following week, March 21.  If I have to do anything different or come in please give me a call.  You can leave me a message if the appointment I'm able to get.  Also when I was there yesterday, I was told I could get a copy of my consent form.  I did not receive that.  I want to know if I can receive that in the mail, [address]  [place], [place]. [address]  I called and rescheduled with Aram Beecham at [facility].  I mailed a copy of the consent form to patient.  I left patient a message that surgery was moved from 10/26/2015 to 11/02/2015.  Copy of consent form will be mailed today.  Sorry I forgot to give it to you yesterday.

## 2015-11-02 ENCOUNTER — Other Ambulatory Visit: Payer: Self-pay | Admitting: Podiatry

## 2015-11-02 ENCOUNTER — Encounter: Payer: Self-pay | Admitting: Podiatry

## 2015-11-02 DIAGNOSIS — M722 Plantar fascial fibromatosis: Secondary | ICD-10-CM | POA: Diagnosis not present

## 2015-11-02 DIAGNOSIS — M21541 Acquired clubfoot, right foot: Secondary | ICD-10-CM | POA: Diagnosis not present

## 2015-11-02 DIAGNOSIS — M2041 Other hammer toe(s) (acquired), right foot: Secondary | ICD-10-CM | POA: Diagnosis not present

## 2015-11-02 MED ORDER — PROMETHAZINE HCL 25 MG PO TABS: 25.0000 mg | ORAL_TABLET | Freq: Three times a day (TID) | ORAL | Status: DC | PRN

## 2015-11-02 NOTE — Progress Notes (Signed)
Spoke with pt husband, he stated that she was nauseated and wanted a prescription to help. Phenergan 25mg  [facility] [address]. Advised to call with any other acute changes

## 2015-11-05 ENCOUNTER — Other Ambulatory Visit: Payer: BLUE CROSS/BLUE SHIELD

## 2015-11-12 ENCOUNTER — Ambulatory Visit (INDEPENDENT_AMBULATORY_CARE_PROVIDER_SITE_OTHER): Payer: BLUE CROSS/BLUE SHIELD

## 2015-11-12 ENCOUNTER — Ambulatory Visit (INDEPENDENT_AMBULATORY_CARE_PROVIDER_SITE_OTHER): Payer: BLUE CROSS/BLUE SHIELD | Admitting: Podiatry

## 2015-11-12 DIAGNOSIS — M2040 Other hammer toe(s) (acquired), unspecified foot: Secondary | ICD-10-CM | POA: Diagnosis not present

## 2015-11-12 DIAGNOSIS — M216X9 Other acquired deformities of unspecified foot: Secondary | ICD-10-CM

## 2015-11-12 MED ORDER — HYDROCODONE-ACETAMINOPHEN 10-325 MG PO TABS: 1.0000 | ORAL_TABLET | Freq: Three times a day (TID) | ORAL | Status: DC | PRN

## 2015-11-12 NOTE — Progress Notes (Signed)
Subjective:     Patient ID: [name], female   DOB: 04/01/1966, 50 y.o.   MRN: [id number]  HPI patient states she's doing well with right foot with patient's digits and good alignment wound edges well coapted and pin in place second toe. States she is still having pain if she tries to walk on it and swelling   Review of Systems     Objective:   Physical Exam Neurovascular status intact negative Homans sign noted digits and good alignment with wound edges well coapted with pin in place second toe right with good alignment noted of the second digit with mild medial displacement but it is stable    Assessment:     Doing well post digital surgery right and metatarsal osteotomy    Plan:     X-ray reviewed and sterile dressing reapplied and instructed on continued elevation and continued compression continued immobilization and reappoint 2 weeks for suture removal or earlier if needed  X-ray report indicated pin is in place screws in place with good alignment

## 2015-11-22 NOTE — Progress Notes (Signed)
[facility], Metatarsal Osteotomy 2nd, Hammer Toe Repair 2nd with pin, 3rd, 4th and 5th right foot, injection heel bilateral.

## 2015-11-25 ENCOUNTER — Ambulatory Visit (INDEPENDENT_AMBULATORY_CARE_PROVIDER_SITE_OTHER): Payer: BLUE CROSS/BLUE SHIELD

## 2015-11-25 ENCOUNTER — Ambulatory Visit (INDEPENDENT_AMBULATORY_CARE_PROVIDER_SITE_OTHER): Payer: BLUE CROSS/BLUE SHIELD | Admitting: Podiatry

## 2015-11-25 DIAGNOSIS — M2040 Other hammer toe(s) (acquired), unspecified foot: Secondary | ICD-10-CM

## 2015-11-25 DIAGNOSIS — Z9889 Other specified postprocedural states: Secondary | ICD-10-CM

## 2015-11-25 NOTE — Patient Instructions (Signed)
Pre-Operative Instructions  Congratulations, you have decided to take an important step to improving your quality of life.  You can be assured that the doctors of [facility] will be with you every step of the way.  1. Plan to be at the surgery center/hospital at least 1 (one) hour prior to your scheduled time unless otherwise directed by the surgical center/hospital staff.  You must have a responsible adult accompany you, remain during the surgery and drive you home.  Make sure you have directions to the surgical center/hospital and know how to get there on time. 2. For hospital based surgery you will need to obtain a history and physical form from your family physician within 1 month prior to the date of surgery- we will give you a form for you primary physician.  3. We make every effort to accommodate the date you request for surgery.  There are however, times where surgery dates or times have to be moved.  We will contact you as soon as possible if a change in schedule is required.   4. No Aspirin/Ibuprofen for one week before surgery.  If you are on aspirin, any non-steroidal anti-inflammatory medications (Mobic, Aleve, Ibuprofen) you should stop taking it 7 days prior to your surgery.  You make take Tylenol  For pain prior to surgery.  5. Medications- If you are taking daily heart and blood pressure medications, seizure, reflux, allergy, asthma, anxiety, pain or diabetes medications, make sure the surgery center/hospital is aware before the day of surgery so they may notify you which medications to take or avoid the day of surgery. 6. No food or drink after midnight the night before surgery unless directed otherwise by surgical center/hospital staff. 7. No alcoholic beverages 24 hours prior to surgery.  No smoking 24 hours prior to or 24 hours after surgery. 8. Wear loose pants or shorts- loose enough to fit over bandages, boots, and casts. 9. No slip on shoes, sneakers are best. 10. Bring  your boot with you to the surgery center/hospital.  Also bring crutches or a walker if your physician has prescribed it for you.  If you do not have this equipment, it will be provided for you after surgery. 11. If you have not been contracted by the surgery center/hospital by the day before your surgery, call to confirm the date and time of your surgery. 12. Leave-time from work may vary depending on the type of surgery you have.  Appropriate arrangements should be made prior to surgery with your employer. 13. Prescriptions will be provided immediately following surgery by your doctor.  Have these filled as soon as possible after surgery and take the medication as directed. 14. Remove nail polish on the operative foot. 15. Wash the night before surgery.  The night before surgery wash the foot and leg well with the antibacterial soap provided and water paying special attention to beneath the toenails and in between the toes.  Rinse thoroughly with water and dry well with a towel.  Perform this wash unless told not to do so by your physician.  Enclosed: 1 Ice pack (please put in freezer the night before surgery)   1 Hibiclens skin cleaner   Pre-op Instructions  If you have any questions regarding the instructions, do not hesitate to call our office.  [place]: [address] [place], [place] [address] [phone]  [place]: [address] [place], [place] [address] [phone]  [place]: [address]  [place], [place] [address] [phone]  Dr. Richard   Tuchman DPM, Dr. Norman Regal DPM Dr. Richard Sikora DPM, Dr. M. Todd Hyatt DPM, Dr. Kathryn Egerton DPM 

## 2015-11-25 NOTE — Progress Notes (Signed)
Subjective:     Patient ID: [name], female   DOB: 12/17/1965, 50 y.o.   MRN: [id number]  HPI patient presents stating I'm doing well with my right foot and ready to get stitches out and I need to get my left foot fixed as soon as possible due to my schedule   Review of Systems     Objective:   Physical Exam Neurovascular status intact muscle strength was adequate with patient found to have excellent position of the right second MPJ. In the second toe and alignment of the third fourth and fifth digits. Wound edges are well coapted and stitches are intact and patient has good relief of pain underneath the second metatarsal. Left foot shows elongated elevated second toe plantar flexed second metatarsal with large keratotic lesion that's painful and digital deformities of the third fourth and fifth toes distal and they're nature    Assessment:     Well-healing surgical foot right with significant structural malalignment of the left foot noted    Plan:     H&P and x-rays of right foot reviewed. Today we discussed left foot correction and patient wants this done and I allowed patient to read consent form for digital fusion digit 2 left shortening osteotomy second left and distal arthroplasty of the third fourth and fifth toes. Patient understands alternative treatments complications and signs consent form and is given all preoperative instructions for surgery and understands just because 1 foot did so well there is no guarantee on the second foot. Patient signed consent form and is scheduled for outpatient surgery in 12 days for left foot and right foot stitches removed sterile dressing applied and we'll remove the pin from the right foot at next visit. Also dispensed a brace to hold the second toe in proper alignment BioSkin above ankle brace  X-ray right indicated good alignment of the digits with pin in place screw in place with no indication of motion

## 2015-12-07 ENCOUNTER — Encounter: Payer: Self-pay | Admitting: Podiatry

## 2015-12-07 DIAGNOSIS — M2042 Other hammer toe(s) (acquired), left foot: Secondary | ICD-10-CM | POA: Diagnosis not present

## 2015-12-07 DIAGNOSIS — M21542 Acquired clubfoot, left foot: Secondary | ICD-10-CM | POA: Diagnosis not present

## 2015-12-08 ENCOUNTER — Telehealth: Payer: Self-pay | Admitting: *Deleted

## 2015-12-08 MED ORDER — HYDROCODONE-ACETAMINOPHEN 10-325 MG PO TABS: 1.0000 | ORAL_TABLET | Freq: Three times a day (TID) | ORAL | Status: DC | PRN

## 2015-12-08 NOTE — Telephone Encounter (Addendum)
Post op courtesy call-Left message instruction pt to RICE, not to be on that surgical foot more than 5 minutes/hour, remain in original dressing and surgical boot at all times, take pain medication as needed, and call our office with concerns.  Pt returned my call and states she is in a lot of pain because the pain medication that Dr. Charlsie Merlesegal prescribed is over $50.00 and her insurance won't cover because it was prescribed less than 30 days before for a different procedure.  I reminded pt of her post op care instructions and told her I would inform Dr. Charlsie Merlesegal and call again.  [facility] [phone] and the pharmacist stated the Demerol is not covered by pt and it would be out of pocket $42.00.  I spoke with pt and told her if the Hydrocodone 10/325 was covered Dr. Charlsie Merlesegal would refill.  Pt agreed and I told pt she would have to have [place]. Pt states she will get someone to pick up and call to let me know who.  Dr. Charlsie Merlesegal refilled the Hydrocodone as previously.  Pt's husband, Casimiro NeedleMichael called states he will pick up the rx after 630pm tonight.  I told Casimiro NeedleMichael I would tape to the lower portion of the back glass window and if available place a cement planter in front of it.  Casimiro NeedleMichael states understanding.  Done.  12/16/2015-Pt states she was seen today by Dr. Charlsie Merlesegal and he was to refill her pain medication and she did not receive the rx while in the office, does she need to come back to pick up.  Informed pt Dr. Charlsie Merlesegal did okay a refill of the last pain medication she was prescribed and she would have to have someone pick it up.  Pt states she'll have her husband pick it up tomorrow.  12/25/2016-Pt states she is unable to get her rx filled, pharmacy states they need to speak with doctor. I spoke with Jace - [facility] [phone] and he states insurance would only cover 7 days of the Tramadol so that was filled, he will run the whole script and see if insurance covers. Jace states the information came  back as a refill too early and would not cover, he states have pt wait until 12/29/2016 and order a refill. Pt called and I informed her of the instructions from [facility], pt states understanding.

## 2015-12-08 NOTE — Progress Notes (Signed)
DOS 12/08/2015 Shortening osteotomy with screw 2nd left, fusion with pin 2nd toe left, distal arthroplasty digits 3,4,5th toes.

## 2015-12-16 ENCOUNTER — Ambulatory Visit (INDEPENDENT_AMBULATORY_CARE_PROVIDER_SITE_OTHER): Payer: BLUE CROSS/BLUE SHIELD | Admitting: Podiatry

## 2015-12-16 ENCOUNTER — Encounter: Payer: Self-pay | Admitting: Podiatry

## 2015-12-16 ENCOUNTER — Ambulatory Visit (INDEPENDENT_AMBULATORY_CARE_PROVIDER_SITE_OTHER): Payer: BLUE CROSS/BLUE SHIELD

## 2015-12-16 VITALS — Ht 66.0 in | Wt 195.0 lb

## 2015-12-16 DIAGNOSIS — Z9889 Other specified postprocedural states: Secondary | ICD-10-CM

## 2015-12-16 DIAGNOSIS — M2040 Other hammer toe(s) (acquired), unspecified foot: Secondary | ICD-10-CM

## 2015-12-16 MED ORDER — HYDROCODONE-ACETAMINOPHEN 10-325 MG PO TABS: 1.0000 | ORAL_TABLET | Freq: Three times a day (TID) | ORAL | Status: DC | PRN

## 2015-12-16 NOTE — Progress Notes (Signed)
Subjective:     Patient ID: [name], female   DOB: 06/11/1966, 50 y.o.   MRN: [id number]  HPI patient states I'm doing pretty well but I am having quite a bit of pain in my feet with the left one bothering me more   Review of Systems     Objective:   Physical Exam Neurovascular status intact muscle strength adequate negative Homans sign noted bilateral with well-healing surgical sites right and left foot with second toe in a slightly dorsiflexed position second toe left but adequate in its nature and pins in place    Assessment:     Doing well post forefoot reconstruction bilateral    Plan:     Reviewed x-rays of the left foot and at this time applied sterile dressing to the left foot lowering the second toe and instructed on gradual increase in activities and reappoint 2 weeks suture removal or earlier if needed  X-ray report indicates the pin is in place toes in good alignment and screws in place second metatarsal with good positional component

## 2015-12-16 NOTE — Progress Notes (Signed)
   Subjective:    Patient ID: [name], female    DOB: 03/31/1966, 50 y.o.   MRN: [id number]  HPI "It's painful, they both are."    Review of Systems     Objective:   Physical Exam        Assessment & Plan:

## 2015-12-31 ENCOUNTER — Ambulatory Visit (INDEPENDENT_AMBULATORY_CARE_PROVIDER_SITE_OTHER): Payer: BLUE CROSS/BLUE SHIELD

## 2015-12-31 ENCOUNTER — Ambulatory Visit (INDEPENDENT_AMBULATORY_CARE_PROVIDER_SITE_OTHER): Payer: BLUE CROSS/BLUE SHIELD | Admitting: Podiatry

## 2015-12-31 DIAGNOSIS — M2040 Other hammer toe(s) (acquired), unspecified foot: Secondary | ICD-10-CM | POA: Diagnosis not present

## 2015-12-31 DIAGNOSIS — Z9889 Other specified postprocedural states: Secondary | ICD-10-CM

## 2016-01-05 NOTE — Progress Notes (Signed)
Patient ID: [name] Amer, female   DOB: 04/03/1966, 50 y.o.   MRN: [id number]  Subjective: [name] is [name] 50 y.o. is seen today in office s/p left forefoot reconstruction preformed on 12/07/15 and the right foot reconstruction on 11/06/15. She is still having some pain on the right but improving. Her pain after surgery on the left is also improving but she is still taking pain medication. She has remained in the surgical shoe. Denies any systemic complaints such as fevers, chills, nausea, vomiting. No calf pain, chest pain, shortness of breath.   Objective: General: No acute distress, AAOx3  DP/PT pulses palpable 2/4, CRT < 3 sec to all digits.  Protective sensation intact. Motor function intact.  Left foot: Incision is well coapted without any evidence of dehiscence and sutures intact. There is no surrounding erythema, ascending cellulitis, fluctuance, crepitus, malodor, drainage/purulence. There is minimal edema around the surgical site. There is mild pain along the surgical site. Toes rectus Right foot: Scars are well-healed. The second toe is [name] slight medial deviation. There is decreasing pain along the surgical site subjectively. She gets some tenderness to touch. She still and surgical shoe on the right side as well. No other areas of tenderness to bilateral lower extremities.  No other open lesions or pre-ulcerative lesions.  No pain with calf compression, swelling, warmth, erythema.   Assessment and Plan:  Status post bilateral forefoot reconstructions   -Treatment options discussed including all alternatives, risks, and complications -On the left side sutures were removed without complications. Antibiotic ointment and [name] dressing was applied. -Continue surgical shoes. -The right second MPJ is stiff I recommended range of motion exercises. Also splint and to help hold the toe in [name] more rectus alignment. -Ice/elevation -Pain medication as needed. -Monitor for any  clinical signs or symptoms of infection and DVT/PE and directed to call the office immediately should any occur or go to the ER. -Follow-up as scheduled or sooner if any problems arise. In the meantime, encouraged to call the office with any questions, concerns, change in symptoms.   Ovid CurdMatthew Jaquelinne Glendening, DPM

## 2016-01-14 ENCOUNTER — Ambulatory Visit (INDEPENDENT_AMBULATORY_CARE_PROVIDER_SITE_OTHER): Payer: BLUE CROSS/BLUE SHIELD

## 2016-01-14 ENCOUNTER — Encounter: Payer: Self-pay | Admitting: Podiatry

## 2016-01-14 ENCOUNTER — Ambulatory Visit (INDEPENDENT_AMBULATORY_CARE_PROVIDER_SITE_OTHER): Payer: BLUE CROSS/BLUE SHIELD | Admitting: Podiatry

## 2016-01-14 DIAGNOSIS — Z9889 Other specified postprocedural states: Secondary | ICD-10-CM

## 2016-01-14 DIAGNOSIS — M2040 Other hammer toe(s) (acquired), unspecified foot: Secondary | ICD-10-CM

## 2016-01-14 MED ORDER — PROMETHAZINE HCL 25 MG PO TABS: 25.0000 mg | ORAL_TABLET | Freq: Three times a day (TID) | ORAL | Status: DC | PRN

## 2016-01-14 MED ORDER — TRAMADOL HCL 50 MG PO TABS: 50.0000 mg | ORAL_TABLET | Freq: Three times a day (TID) | ORAL | Status: DC

## 2016-01-17 NOTE — Progress Notes (Signed)
Subjective:     Patient ID: [name], female   DOB: 07/07/1966, 50 y.o.   MRN: [id number]  HPI patient states I'm doing well with my feet but I'm still sore and they feel swollen and tight   Review of Systems     Objective:   Physical Exam Neurovascular status intact muscle strength adequate with correction of the left foot of a little over 2 months and right foot around one month with good alignment noted pin in place and slight medial rotation of the second digit bilateral with wound edges well coapted    Assessment:     Doing well with slight medial rotation of the digits and tightness of the tissue which is relatively normal for this. Postop    Plan:     H&P and conditions and x-rays reviewed with patient. I dispensed a above ankle brace in order to try to lower the second toe and gave instructions on stretching the second digit and I removed the pin and applied sterile dressing. I reviewed her x-rays and instructed her on physical therapy and gradual return soft shoe gear  Thurston Poundsrey report indicates the screws are in place with excellent alignment and digits do not have significant dorsal lifting upon lateral view evaluation

## 2016-01-18 ENCOUNTER — Telehealth: Payer: Self-pay | Admitting: *Deleted

## 2016-01-18 NOTE — Telephone Encounter (Addendum)
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

## 2016-02-09 DIAGNOSIS — M2040 Other hammer toe(s) (acquired), unspecified foot: Secondary | ICD-10-CM

## 2016-02-14 ENCOUNTER — Ambulatory Visit (INDEPENDENT_AMBULATORY_CARE_PROVIDER_SITE_OTHER): Payer: BLUE CROSS/BLUE SHIELD | Admitting: Podiatry

## 2016-02-14 ENCOUNTER — Ambulatory Visit: Payer: BLUE CROSS/BLUE SHIELD

## 2016-02-14 ENCOUNTER — Encounter: Payer: Self-pay | Admitting: Podiatry

## 2016-02-14 DIAGNOSIS — M2040 Other hammer toe(s) (acquired), unspecified foot: Secondary | ICD-10-CM

## 2016-02-14 DIAGNOSIS — Z9889 Other specified postprocedural states: Secondary | ICD-10-CM

## 2016-02-16 NOTE — Progress Notes (Signed)
Subjective:     Patient ID: [name], female   DOB: 01/28/1966, 50 y.o.   MRN: [id number]  HPI patient states I'm feeling much better with minimal discomfort and able to walk but at times my foot still swells especially when I been on it too long   Review of Systems     Objective:   Physical Exam Neurovascular status intact muscle strength adequate with discomfort in the plantar aspect of a mild to moderate nature with good alignment of the toes but still stiff    Assessment:     Doing well but still needs to keep working on range of motion exercises    Plan:     Discussed continued range of motion exercises anti-inflammatories and utilization of compression. Reappoint 8 weeks or earlier if needed  X-ray report indicates the screws in place the toe is Was slight elevation but healing well with no other indications of pathology

## 2016-04-24 ENCOUNTER — Ambulatory Visit (INDEPENDENT_AMBULATORY_CARE_PROVIDER_SITE_OTHER): Payer: BLUE CROSS/BLUE SHIELD

## 2016-04-24 ENCOUNTER — Ambulatory Visit (INDEPENDENT_AMBULATORY_CARE_PROVIDER_SITE_OTHER): Payer: BLUE CROSS/BLUE SHIELD | Admitting: Podiatry

## 2016-04-24 DIAGNOSIS — M2040 Other hammer toe(s) (acquired), unspecified foot: Secondary | ICD-10-CM

## 2016-04-24 DIAGNOSIS — Z9889 Other specified postprocedural states: Secondary | ICD-10-CM

## 2016-04-24 NOTE — Progress Notes (Signed)
Subjective:     Patient ID: [name], female   DOB: 04/12/1966, 50 y.o.   MRN: [id number]  HPI patient states she's doing well with continued diminishment of discomfort   Review of Systems     Objective:   Physical Exam Neurovascular status intact negative Homans sign noted with good alignment noted of the digits on both feet with the second MPJ being significant reduction of discomfort    Assessment:     Doing well post foot reconstruction bilateral    Plan:     Advised on continuation of elevation anti-inflammatories and physical therapy and gradual return to all normal shoe gear and reappoint as needed  X-ray report indicated good healing of the osteotomy with good positional component and toes that are slightly elevated but in relatively normal position

## 2016-04-29 ENCOUNTER — Other Ambulatory Visit: Payer: Self-pay | Admitting: Cardiology

## 2016-05-01 NOTE — Telephone Encounter (Signed)
Rx(s) sent to pharmacy electronically.  

## 2016-06-07 ENCOUNTER — Ambulatory Visit (INDEPENDENT_AMBULATORY_CARE_PROVIDER_SITE_OTHER): Payer: BLUE CROSS/BLUE SHIELD | Admitting: Cardiology

## 2016-06-07 ENCOUNTER — Encounter: Payer: Self-pay | Admitting: Podiatry

## 2016-06-07 ENCOUNTER — Ambulatory Visit (INDEPENDENT_AMBULATORY_CARE_PROVIDER_SITE_OTHER): Payer: BLUE CROSS/BLUE SHIELD

## 2016-06-07 ENCOUNTER — Ambulatory Visit (INDEPENDENT_AMBULATORY_CARE_PROVIDER_SITE_OTHER): Payer: BLUE CROSS/BLUE SHIELD | Admitting: Podiatry

## 2016-06-07 ENCOUNTER — Encounter: Payer: Self-pay | Admitting: Cardiology

## 2016-06-07 VITALS — BP 141/87 | HR 107 | Resp 16

## 2016-06-07 VITALS — BP 128/82 | HR 76 | Ht 66.0 in | Wt 210.2 lb

## 2016-06-07 DIAGNOSIS — R002 Palpitations: Secondary | ICD-10-CM

## 2016-06-07 DIAGNOSIS — I10 Essential (primary) hypertension: Secondary | ICD-10-CM

## 2016-06-07 DIAGNOSIS — M79672 Pain in left foot: Secondary | ICD-10-CM

## 2016-06-07 DIAGNOSIS — M79671 Pain in right foot: Secondary | ICD-10-CM

## 2016-06-07 DIAGNOSIS — M7989 Other specified soft tissue disorders: Secondary | ICD-10-CM | POA: Diagnosis not present

## 2016-06-07 DIAGNOSIS — M2040 Other hammer toe(s) (acquired), unspecified foot: Secondary | ICD-10-CM

## 2016-06-07 DIAGNOSIS — E785 Hyperlipidemia, unspecified: Secondary | ICD-10-CM

## 2016-06-07 DIAGNOSIS — M722 Plantar fascial fibromatosis: Secondary | ICD-10-CM | POA: Diagnosis not present

## 2016-06-07 MED ORDER — METOPROLOL SUCCINATE ER 50 MG PO TB24: ORAL_TABLET | ORAL | 3 refills | Status: DC

## 2016-06-07 MED ORDER — TRIAMCINOLONE ACETONIDE 10 MG/ML IJ SUSP
10.0000 mg | Freq: Once | INTRAMUSCULAR | Status: AC
  Administered 2016-06-07: 10 mg

## 2016-06-07 MED ORDER — AMLODIPINE BESYLATE 10 MG PO TABS: 5.0000 mg | ORAL_TABLET | Freq: Every evening | ORAL | 3 refills | Status: DC

## 2016-06-07 MED ORDER — OLMESARTAN MEDOXOMIL 40 MG PO TABS: 40.0000 mg | ORAL_TABLET | Freq: Every morning | ORAL | 3 refills | Status: DC

## 2016-06-07 MED ORDER — HYDROCHLOROTHIAZIDE 12.5 MG PO CAPS: ORAL_CAPSULE | ORAL | 3 refills | Status: DC

## 2016-06-07 MED ORDER — POTASSIUM CHLORIDE CRYS ER 20 MEQ PO TBCR: 20.0000 meq | EXTENDED_RELEASE_TABLET | Freq: Every day | ORAL | 3 refills | Status: DC

## 2016-06-07 NOTE — Progress Notes (Signed)
PCP: Colette Ribas, MD  Clinic Note: Chief Complaint  Patient presents with  . Shortness of Breath    pt states when doing things   . Edema    both feet    HPI: [name] is a 50 y.o. female with a PMH below who presents today for Annual follow-up of hypertension, hyperlipidemia and other cardiac risk factor modification.[name] was last seen in September 2016. She had lots of noncardiac issues discussed but was doing relatively well from a cardiac standpoint. Was having myalgias and muscle cramps. HCTZ was stopped by her rheumatologist - but this led to swelling in her hands and feet. Noted orthostatic dizziness and headaches. I recommended that she used to HCTZ when necessary. Increase Toprol 50 twice a day.  Recent Hospitalizations: None  Studies Reviewed: None  Interval History: [name] presents today doing fairly well overall. She is now finally starting to get back on her feet since she had bilateral foot surgery. She is not yet able to actually do full walking type exercises for weightbearing, but is now starting to walk some. She says some residual swelling from the surgeries, but not really edema. A cardiac standpoint velvety stable. She has occasional tightness and shortness of breath with activity but to be 7 more to being out of shape. She just notes that she feels short of breath a lot faster with activity than she used to because she has not been overdoing activity. She is also gained quite a bit of weight since being out of exercise. Otherwise she denies any resting chest tightness or pressure/dyspnea.  Her edema is pretty well-controlled and she has not really had an take the HCTZ as frequent. When she does take it she gets her cramping. She maybe takes it once a week at the most twice.  Her PCP recently restarted pravastatin. But she does not know the dose.  Made her cardiovascular review of symptoms as follows: No PND, orthopnea or  edema.  No palpitations, lightheadedness, dizziness, weakness or syncope/near syncope. No TIA/amaurosis fugax symptoms. No melena, hematochezia, hematuria, or epstaxis. No claudication.   ROS: A comprehensive was performed. Review of Systems  Constitutional: Negative for malaise/fatigue (A little bit of decrease excess tolerance, simply because she has not done any exercise) and weight loss (Weight gain).  HENT: Negative for congestion and nosebleeds.   Respiratory: Positive for shortness of breath (If she exerts herself).   Cardiovascular:       Per history of present illness  Gastrointestinal: Negative for blood in stool, constipation, heartburn and melena.  Genitourinary: Negative for hematuria.  Musculoskeletal: Positive for joint pain. Negative for back pain, falls, myalgias and neck pain.       Both feet actually hurting less than they were initially. She is still having some swelling in and discomfort though and is not yet ready to put full weight bearing.  Neurological: Negative for dizziness, loss of consciousness and headaches.  Endo/Heme/Allergies: Negative for environmental allergies.  Psychiatric/Behavioral: Negative for depression and memory loss. The patient is not nervous/anxious and does not have insomnia.    Past Medical History:  Diagnosis Date  . Constipation - functional   . Ectopic pregnancy   . Essential hypertension   . Hyperlipidemia LDL goal <130   . Obesity   . Palpitations    Normal echocardiogram with no valvular lesions. Normal nuclear stress test in March 2011 and August 2014  . Stress incontinence   . Thyroid disease  isolated, one month only, not on synthroid since 2013    Past Surgical History:  Procedure Laterality Date  . CardioNet Monitor  July 2014    Sinus rhythm, sinus tachycardia.  Shortness of breath, dizziness, and chest pain noted with sinus tachycardia  . CHOLECYSTECTOMY    . COLONOSCOPY N/A 02/06/2014   SLF:1. Normal mucosa in  the terrminal ileum 2. The LEFT colon is redundant 3. The colon mucosa was otherwise normal 4. Moderate sizxed internal hemorrhoids 5. No source for change in the bowel habits identified. constipation most likely due to functional constipation.   [facility]. DOPPLER ECHOCARDIOGRAPHY  March 2011; July 2014   Normal EF, essentially normal; no valvular lesions.  No PFO or ASD  . LAPAROSCOPIC ENDOMETRIOSIS FULGURATION  1990  . Treadmill Myoview Nuclear Stress Test  March 2011, August 2014   a) 2011: 6 minutes, 7 METS; Diaphragmatic attenuation otherwise no evidence of ischemia or infarction; b) 2014: Walked at 9 min, 10.1 METs, no ischemia or infarction with normal EF 66%. No EKG changes.    Prior to Admission medications   Medication Sig Start Date End Date Taking? Authorizing Provider  amLODipine (NORVASC) 10 MG tablet Take 5 mg by mouth every evening. 01/21/13  Yes Lorre NickAnthony Allen, MD  cyclobenzaprine (FLEXERIL) 10 MG tablet Take 10 mg by mouth 3 (three) times daily as needed. 05/04/15  Yes Historical Provider, MD  diphenhydrAMINE (BENADRYL) 25 MG tablet Take 25 mg by mouth every 6 (six) hours as needed.   Yes Historical Provider, MD  hydrochlorothiazide (MICROZIDE) 12.5 MG capsule TAKE 2 or 3 times a week for swelling 05/12/15  Yes Marykay Lexavid W Harding, MD  HYDROcodone-acetaminophen [facility](NORCO) 10-325 MG tablet Take 1 tablet by mouth every 8 (eight) hours as needed. 12/16/15  Yes Norman S Regal, DPM  KLOR-CON M20 20 MEQ tablet Take 20 mEq by mouth daily.  12/13/13  Yes Historical Provider, MD  Linaclotide 290 MCG CAPS Take 290 mcg by mouth at bedtime.    Yes Historical Provider, MD  metoprolol succinate (TOPROL-XL) 50 MG 24 hr tablet TAKE 1 TABLET BY MOUTH EVERY DAY WITH FOOD OR IMMEDIATELY FOLLOWING A MEAL 05/01/16  Yes Marykay Lexavid W Harding, MD  olmesartan (BENICAR) 40 MG tablet Take 40 mg by mouth every morning.    Yes Historical Provider, MD  traMADol (ULTRAM) 50 MG tablet Take 1 tablet (50 mg total) by mouth 3 (three) times  daily. 01/14/16  Yes Lenn SinkNorman S Regal, DPM  - is back on Pravastatin (unsure of dose)  Allergies  Allergen Reactions  . Doxycycline Itching and Rash     Social History   Social History  . Marital status: Married    Spouse name: N/A  . Number of children: 2  . Years of education: College   Occupational History  . Customer service Other    [employer]   Social History Main Topics  . Smoking status: Never Smoker  . Smokeless tobacco: Never Used  . Alcohol use No  . Drug use: No  . Sexual activity: No   Other Topics Concern  . None   Social History Narrative   Remain since her last visit. Her husband and is Casimiro NeedleMichael.      Is now working on exercising regularly doing walks for 45 to 60 minutes a day 5 days a week.    Family History  Problem Relation Age of Onset  . Hypertension Mother   . Hypertension Father   . Colon cancer Neg Hx   .  Liver disease Neg Hx     Wt Readings from Last 3 Encounters:  06/07/16 95.3 kg (210 lb 3.2 oz)  12/16/15 88.5 kg (195 lb)  05/12/15 90.1 kg (198 lb 11.2 oz)    PHYSICAL EXAM BP 128/82   Pulse 76   Ht 5\' 6"  (1.676 m)   Wt 95.3 kg (210 lb 3.2 oz)   BMI 33.93 kg/m  General appearance: alert, cooperative, no distress and Pleasant mood and affect.  Neck: no carotid bruit and no JVD  Lungs: clear to auscultation bilaterally, normal percussion bilaterally and Nonlabored, good air movement.  Heart: normal apical impulse, regular rate and rhythm, S1And S2 normal, no S3 or S4 and systolic murmur: early systolic 1/6, low pitch and harsh at lower left sternal border  Abdomen: soft, non-tender; bowel sounds normal; no masses, no organomegaly  Extremities: extremities normal, atraumatic, no cyanosis or edema - Mild puffy swelling from surgery.; No redness or tenderness in the calves or thighs and no ulcers; Pulses: 2+ and symmetric  Neurologic: A&O X 3, normal strength and tone. Normal symmetric reflexes. Normal coordination With slight  antalgic gait    Adult ECG Report  Rate: 76 ;  Rhythm: normal sinus rhythm and sinus arrhythmia; otherwise normal axis, intervals and durations  Narrative Interpretation: Normal/stable   Other studies Reviewed: Additional studies/ records that were reviewed today include:  Recent Labs:  She said that her labs were checked by her PCP recently - not available No results found for: CHOL, HDL, LDLCALC, LDLDIRECT, TRIG, CHOLHDL   ASSESSMENT / PLAN: Problem List Items Addressed This Visit    Swelling of both lower and upper extremities (Chronic)    Minimal and controlled with HCTZ. She is scared to use much more than she does because of cramping. I told her to make sure she takes the potassium along with it and also takes some mustard for the cramping.      Palpitations (Chronic)    Pretty much nonexistent since I increased Toprol.      Relevant Orders   EKG 12-Lead   Hyperlipidemia with target LDL less than 130 (Chronic)    Monitored by PCP. Now back on pravastatin. I don't note this has much to do with her cramping.      Relevant Medications   metoprolol succinate (TOPROL-XL) 50 MG 24 hr tablet   olmesartan (BENICAR) 40 MG tablet   hydrochlorothiazide (MICROZIDE) 12.5 MG capsule   amLODipine (NORVASC) 10 MG tablet   Other Relevant Orders   EKG 12-Lead   Essential hypertension - Primary (Chronic)    Well-controlled now despite not being on standing dose of HCTZ. She is on good dose of Toprol and normal starting in along with amlodipine. No bradycardia with increased dose of Toprol      Relevant Medications   metoprolol succinate (TOPROL-XL) 50 MG 24 hr tablet   olmesartan (BENICAR) 40 MG tablet   hydrochlorothiazide (MICROZIDE) 12.5 MG capsule   amLODipine (NORVASC) 10 MG tablet   Other Relevant Orders   EKG 12-Lead    Other Visit Diagnoses   None.     Current medicines are reviewed at length with the patient today. (+/- concerns) none The following changes have  been made: See below  Patient Instructions  NO CHANGES WITH CURRENT MEDICATIONS   Your physician wants you to follow-up in: 12 MONTHS WITH DR HARDING. You will receive a reminder letter in the mail two months in advance. If you don't receive a letter, please call  our office to schedule the follow-up appointment.   If you need a refill on your cardiac medications before your next appointment, please call your pharmacy.    Studies Ordered:   Orders Placed This Encounter  Procedures  . EKG 12-Lead      Bryan Lemma, M.D., M.S. Interventional Cardiologist   Pager # [phone] Phone # [phone] [address] [place], [place] [address]

## 2016-06-07 NOTE — Patient Instructions (Signed)

## 2016-06-07 NOTE — Patient Instructions (Addendum)
NO CHANGES WITH CURRENT MEDICATIONS   Your physician wants you to follow-up in 12 MONTHS WITH DR HARDING. You will receive a reminder letter in the mail two months in advance. If you don't receive a letter, please call our office to schedule the follow-up appointment.   If you need a refill on your cardiac medications before your next appointment, please call your pharmacy.  

## 2016-06-08 ENCOUNTER — Encounter: Payer: Self-pay | Admitting: Cardiology

## 2016-06-08 NOTE — Assessment & Plan Note (Signed)
Minimal and controlled with HCTZ. She is scared to use much more than she does because of cramping. I told her to make sure she takes the potassium along with it and also takes some mustard for the cramping.

## 2016-06-08 NOTE — Assessment & Plan Note (Signed)
Monitored by PCP. Now back on pravastatin. I don't note this has much to do with her cramping.

## 2016-06-08 NOTE — Assessment & Plan Note (Signed)
Pretty much nonexistent since I increased Toprol.

## 2016-06-08 NOTE — Progress Notes (Signed)
Subjective:     Patient ID: [name], female   DOB: 10/21/1965, 50 y.o.   MRN: [id number]  HPI patient presents stating that the heels on both feet have been bothering her and she dropped something on top of her right foot creating inflammation but the discomfort in her forefoot is continuing to reduce   Review of Systems     Objective:   Physical Exam Neurovascular status intact with inflammation of the plantar heel region bilateral with pain in the dorsum of the right foot with mild edema secondary to trauma    Assessment:     Chronic plantar fasciitis bilateral with dorsal tendinitis right with possibility for bone trauma    Plan:     X-rays reviewed bilateral and today I injected the plantar fascia bilateral 3 mg Kenalog 5 mg Xylocaine and scanned for custom orthotics to lift the arch is due to chronic discomfort. Forefoot looks good at this time  X-rays indicate there is small plantar spurring bilateral with depression of the arch and screws in place second metatarsal bilateral with good alignment of the toes and no indication of elevation on lateral view

## 2016-06-08 NOTE — Assessment & Plan Note (Signed)
Well-controlled now despite not being on standing dose of HCTZ. She is on good dose of Toprol and normal starting in along with amlodipine. No bradycardia with increased dose of Toprol

## 2016-07-05 ENCOUNTER — Ambulatory Visit (INDEPENDENT_AMBULATORY_CARE_PROVIDER_SITE_OTHER): Payer: BLUE CROSS/BLUE SHIELD | Admitting: Podiatry

## 2016-07-05 DIAGNOSIS — M722 Plantar fascial fibromatosis: Secondary | ICD-10-CM

## 2016-07-05 NOTE — Progress Notes (Signed)
Patient presents for orthotic pick up.  Verbal and written break in and wear instructions given.  Patient will follow up in 4 weeks if symptoms worsen or fail to improve. 

## 2016-07-05 NOTE — Patient Instructions (Signed)

## 2016-11-09 ENCOUNTER — Telehealth: Payer: Self-pay

## 2016-11-09 NOTE — Telephone Encounter (Signed)
LVM informing pt in order to refill Tramadol request she would need to come in to be seen by Dr Charlsie Merlesegal to re-assess her pain level

## 2016-12-06 ENCOUNTER — Ambulatory Visit (INDEPENDENT_AMBULATORY_CARE_PROVIDER_SITE_OTHER): Payer: BLUE CROSS/BLUE SHIELD | Admitting: Podiatry

## 2016-12-06 ENCOUNTER — Encounter: Payer: Self-pay | Admitting: Podiatry

## 2016-12-06 ENCOUNTER — Ambulatory Visit (INDEPENDENT_AMBULATORY_CARE_PROVIDER_SITE_OTHER): Payer: BLUE CROSS/BLUE SHIELD

## 2016-12-06 DIAGNOSIS — M722 Plantar fascial fibromatosis: Secondary | ICD-10-CM

## 2016-12-06 DIAGNOSIS — M79671 Pain in right foot: Secondary | ICD-10-CM

## 2016-12-06 DIAGNOSIS — R600 Localized edema: Secondary | ICD-10-CM

## 2016-12-06 DIAGNOSIS — M79672 Pain in left foot: Principal | ICD-10-CM

## 2016-12-06 MED ORDER — TRIAMCINOLONE ACETONIDE 10 MG/ML IJ SUSP
10.0000 mg | Freq: Once | INTRAMUSCULAR | Status: AC
  Administered 2016-12-06: 10 mg

## 2016-12-06 MED ORDER — TRAMADOL HCL 50 MG PO TABS: 50.0000 mg | ORAL_TABLET | Freq: Three times a day (TID) | ORAL | 2 refills | Status: DC

## 2016-12-06 MED ORDER — GABAPENTIN 300 MG PO CAPS: 300.0000 mg | ORAL_CAPSULE | Freq: Three times a day (TID) | ORAL | 3 refills | Status: DC

## 2016-12-06 NOTE — Progress Notes (Signed)
Subjective:    Patient ID: [name], female   DOB: 51 y.o.   MRN: [id number]   HPI patient states her heel is very sore on both feet and she has had some swelling in both feet that's worse the end of the day. Overall her feet are doing well from her surgery but she does get some tingling also in her toes    ROS      Objective:  Physical Exam Neurovascular status intact with wound edges that are well coapted and exquisite discomfort plantar heel bilateral. Patient does have very mild edema but I did not note any pitting type edema and she states that swelling does not but now it gets worse later and all structure looks good with mild elevation second toe left foot    Assessment:     Overall doing well with possibility of some nerve ending irritation and plantar fasciitis    Plan:    H&P and x-rays taken reviewed. Today I went ahead and I injected the plantar fascia bilateral 3 mg Kenalog 5 mg Xylocaine and I placed on tramadol to help with discomfort she experiences and we're going to try low-dose gabapentin at night to see whether or not this helps with the nerve pain she is experiencing. Reappoint for [place] to recheck again in the next several weeks  X-rays indicate that the osteotomy has healed very well bilateral slight elevation second toe left is noted but overall excellent bone structure with moderate spur formation plantar heel bilateral

## 2016-12-22 ENCOUNTER — Telehealth: Payer: Self-pay | Admitting: *Deleted

## 2016-12-22 NOTE — Telephone Encounter (Signed)
Received Approval for Tramadol from [facility] valid 11/21/2017 - 12/21/2017. Faxed copy of the approval to [id number].

## 2016-12-25 ENCOUNTER — Telehealth: Payer: Self-pay | Admitting: Podiatry

## 2016-12-25 NOTE — Telephone Encounter (Signed)
Pt. Said she cant get her medication, because the Pharmacy said they would need to speak with the Doctor.

## 2016-12-26 NOTE — Telephone Encounter (Signed)
Not sure what medicine

## 2016-12-27 ENCOUNTER — Ambulatory Visit: Payer: BLUE CROSS/BLUE SHIELD | Admitting: Podiatry

## 2017-01-31 ENCOUNTER — Ambulatory Visit: Payer: BLUE CROSS/BLUE SHIELD

## 2017-01-31 ENCOUNTER — Ambulatory Visit (INDEPENDENT_AMBULATORY_CARE_PROVIDER_SITE_OTHER): Payer: BLUE CROSS/BLUE SHIELD | Admitting: Podiatry

## 2017-01-31 ENCOUNTER — Encounter: Payer: Self-pay | Admitting: Podiatry

## 2017-01-31 DIAGNOSIS — M722 Plantar fascial fibromatosis: Secondary | ICD-10-CM

## 2017-02-01 NOTE — Progress Notes (Signed)
Subjective:    Patient ID: [name], female   DOB: 51 y.o.   MRN: [id number]   HPI patient states still getting some irritation from where the injections were done with mild diminishment of discomfort but quite a bit of pain when first it getting up in the morning and after sitting    ROS      Objective:  Physical Exam neurovascular status intact with patient having plantar fascial inflammation still present left over right     Assessment:    Continuation of plantar fascial irritation     Plan:    Advised on physical therapy anti-inflammatories and long-term I recommended night splint to utilize when sleeping and during the evening. Patient at this time is placed in night splint and given instructions on physical therapy

## 2017-02-08 ENCOUNTER — Telehealth: Payer: Self-pay | Admitting: *Deleted

## 2017-02-08 MED ORDER — GABAPENTIN 300 MG PO CAPS: 300.0000 mg | ORAL_CAPSULE | Freq: Three times a day (TID) | ORAL | 3 refills | Status: DC

## 2017-02-08 NOTE — Telephone Encounter (Signed)
Patient requested 90 day fill - new Rx sent to pharmacy

## 2017-03-16 ENCOUNTER — Ambulatory Visit: Payer: BLUE CROSS/BLUE SHIELD | Admitting: Podiatry

## 2017-03-19 ENCOUNTER — Ambulatory Visit (INDEPENDENT_AMBULATORY_CARE_PROVIDER_SITE_OTHER): Payer: BLUE CROSS/BLUE SHIELD | Admitting: Podiatry

## 2017-03-19 ENCOUNTER — Encounter: Payer: Self-pay | Admitting: Podiatry

## 2017-03-19 DIAGNOSIS — M722 Plantar fascial fibromatosis: Secondary | ICD-10-CM | POA: Diagnosis not present

## 2017-03-19 NOTE — Progress Notes (Signed)
Subjective:    Patient ID: [name], female   DOB: 51 y.o.   MRN: [id number]   HPI patient states that my feet are feeling some better but I'm still getting some swelling and I still get pain in my heels    ROS      Objective:  Physical Exam neurovascular status intact with patient continued discomfort plantar heels with mild edema in the foot negative for pitting edema     Assessment:   Fasciitis-like symptoms with mild edema which may be pain related      Plan:    Education concerning vein issues was discussed with patient and at this time I did dispense a second night splint so that she could use both of them at the same time. Patient be seen back to recheck if symptoms persist or get worse and we'll also use heat ice therapy

## 2017-05-02 DIAGNOSIS — N62 Hypertrophy of breast: Secondary | ICD-10-CM | POA: Diagnosis not present

## 2017-05-03 ENCOUNTER — Ambulatory Visit: Payer: BLUE CROSS/BLUE SHIELD | Admitting: Podiatry

## 2017-06-04 ENCOUNTER — Emergency Department (HOSPITAL_COMMUNITY): Payer: BLUE CROSS/BLUE SHIELD

## 2017-06-04 ENCOUNTER — Emergency Department (HOSPITAL_COMMUNITY)
Admission: EM | Admit: 2017-06-04 | Discharge: 2017-06-04 | Disposition: A | Discharge status: Home / Self Care | Payer: BLUE CROSS/BLUE SHIELD | Attending: Emergency Medicine | Admitting: Emergency Medicine

## 2017-06-04 ENCOUNTER — Encounter (HOSPITAL_COMMUNITY): Payer: Self-pay | Admitting: *Deleted

## 2017-06-04 DIAGNOSIS — R002 Palpitations: Secondary | ICD-10-CM | POA: Diagnosis not present

## 2017-06-04 DIAGNOSIS — Z79899 Other long term (current) drug therapy: Secondary | ICD-10-CM | POA: Diagnosis not present

## 2017-06-04 DIAGNOSIS — I10 Essential (primary) hypertension: Secondary | ICD-10-CM | POA: Diagnosis not present

## 2017-06-04 DIAGNOSIS — R0602 Shortness of breath: Secondary | ICD-10-CM | POA: Insufficient documentation

## 2017-06-04 DIAGNOSIS — R079 Chest pain, unspecified: Secondary | ICD-10-CM | POA: Insufficient documentation

## 2017-06-04 LAB — CBC
HCT: 37.9 % (ref 36.0–46.0)
Hemoglobin: 12.4 g/dL (ref 12.0–15.0)
MCH: 31.6 pg (ref 26.0–34.0)
MCHC: 32.7 g/dL (ref 30.0–36.0)
MCV: 96.4 fL (ref 78.0–100.0)
PLATELETS: 271 10*3/uL (ref 150–400)
RBC: 3.93 MIL/uL (ref 3.87–5.11)
RDW: 13.6 % (ref 11.5–15.5)
WBC: 7.2 10*3/uL (ref 4.0–10.5)

## 2017-06-04 LAB — I-STAT TROPONIN, ED
Troponin i, poc: 0 ng/mL (ref 0.00–0.08)
Troponin i, poc: 0 ng/mL (ref 0.00–0.08)

## 2017-06-04 LAB — BRAIN NATRIURETIC PEPTIDE: B Natriuretic Peptide: 131.6 pg/mL — ABNORMAL HIGH (ref 0.0–100.0)

## 2017-06-04 LAB — BASIC METABOLIC PANEL
ANION GAP: 7 (ref 5–15)
BUN: 8 mg/dL (ref 6–20)
CHLORIDE: 107 mmol/L (ref 101–111)
CO2: 26 mmol/L (ref 22–32)
CREATININE: 1 mg/dL (ref 0.44–1.00)
Calcium: 8.9 mg/dL (ref 8.9–10.3)
GFR calc non Af Amer: >60 mL/min (ref >60)
GLUCOSE: 109 mg/dL — AB (ref 65–99)
Potassium: 3.7 mmol/L (ref 3.5–5.1)
SODIUM: 140 mmol/L (ref 135–145)

## 2017-06-04 MED ORDER — METHOCARBAMOL 500 MG PO TABS
750.0000 mg | ORAL_TABLET | Freq: Once | ORAL | Status: AC
  Administered 2017-06-04: 750 mg via ORAL
  Filled 2017-06-04: qty 2

## 2017-06-04 NOTE — ED Provider Notes (Signed)
[facility] EMERGENCY DEPARTMENT Provider Note   CSN: [id number] Arrival date & time: 06/04/17  1316  History   Chief Complaint Chief Complaint  Patient presents with  . Palpitations  . Chest Pain  . Shortness of Breath   HPI [name] is a 51 y.o. female.  The history is provided by the patient.  Chest Pain   This is a new problem. The current episode started 3 to 5 hours ago. The problem occurs rarely. The problem has been resolved. The pain is associated with rest. The pain is present in the substernal region and lateral region. The pain is at a severity of 5/10. The pain is moderate. The quality of the pain is described as sharp. The pain does not radiate. Associated symptoms include headaches, lower extremity edema, malaise/fatigue, palpitations and shortness of breath. Pertinent negatives include no abdominal pain, no back pain, no cough, no fever and no vomiting.  Pertinent negatives for past medical history include no seizures.   Past Medical History:  Diagnosis Date  . Constipation - functional   . Ectopic pregnancy   . Essential hypertension   . Hyperlipidemia LDL goal <130   . Obesity   . Palpitations    Normal echocardiogram with no valvular lesions. Normal nuclear stress test in March 2011 and August 2014  . Stress incontinence   . Thyroid disease    isolated, one month only, not on synthroid since 2013   Patient Active Problem List   Diagnosis Date Noted  . Swelling of both lower and upper extremities 05/13/2015  . Transaminitis 10/08/2014  . Obesity (BMI 30-39.9) 04/01/2014  . Hyperlipidemia with target LDL less than 130 12/16/2013  . Palpitations 02/01/2013  . Murmur 02/01/2013  . Essential hypertension 03/28/2010  . CONSTIPATION, CHRONIC 03/28/2010  . Abdominal pain 03/28/2010    Past Surgical History:  Procedure Laterality Date  . CardioNet Monitor  July 2014    Sinus rhythm, sinus tachycardia.  Shortness of breath,  dizziness, and chest pain noted with sinus tachycardia  . CHOLECYSTECTOMY    . COLONOSCOPY N/A 02/06/2014   SLF:1. Normal mucosa in the terrminal ileum 2. The LEFT colon is redundant 3. The colon mucosa was otherwise normal 4. Moderate sizxed internal hemorrhoids 5. No source for change in the bowel habits identified. constipation most likely due to functional constipation.   [facility] ECHOCARDIOGRAPHY  March 2011; July 2014   Normal EF, essentially normal; no valvular lesions.  No PFO or ASD  . LAPAROSCOPIC ENDOMETRIOSIS FULGURATION  1990  . Treadmill Myoview Nuclear Stress Test  March 2011, August 2014   a) 2011: 6 minutes, 7 METS; Diaphragmatic attenuation otherwise no evidence of ischemia or infarction; b) 2014: Walked at 9 min, 10.1 METs, no ischemia or infarction with normal EF 66%. No EKG changes.   OB History    No data available     Home Medications    Prior to Admission medications   Medication Sig Start Date End Date Taking? Authorizing Provider  amLODipine (NORVASC) 10 MG tablet Take 0.5 tablets (5 mg total) by mouth every evening. 06/07/16   Marykay Lex, MD  diphenhydrAMINE (BENADRYL) 25 MG tablet Take 25 mg by mouth every 6 (six) hours as needed.    [provider]  gabapentin (NEURONTIN) 300 MG capsule Take 1 capsule (300 mg total) by mouth 3 (three) times daily. 02/08/17   Lenn Sink, DPM  hydrochlorothiazide (MICROZIDE) 12.5 MG capsule TAKE 2 or 3 times  a week for swelling 06/07/16   Marykay Lex, MD  metoprolol succinate (TOPROL-XL) 50 MG 24 hr tablet TAKE 1 TABLET BY MOUTH EVERY DAY WITH FOOD OR IMMEDIATELY FOLLOWING A MEAL 06/07/16   Marykay Lex, MD  olmesartan (BENICAR) 40 MG tablet Take 1 tablet (40 mg total) by mouth every morning. 06/07/16   Marykay Lex, MD  potassium chloride SA (KLOR-CON M20) 20 MEQ tablet Take 1 tablet (20 mEq total) by mouth daily. 06/07/16   Marykay Lex, MD  traMADol (ULTRAM) 50 MG tablet Take 1 tablet (50 mg  total) by mouth 3 (three) times daily. 12/06/16   Lenn Sink, DPM   Family History Family History  Problem Relation Age of Onset  . Hypertension Mother   . Hypertension Father   . Colon cancer Neg Hx   . Liver disease Neg Hx    Social History Social History  Substance Use Topics  . Smoking status: Never Smoker  . Smokeless tobacco: Never Used  . Alcohol use No   Allergies   Doxycycline  Review of Systems Review of Systems  Constitutional: Positive for malaise/fatigue. Negative for chills and fever.  HENT: Negative for ear pain and sore throat.   Eyes: Negative for pain and visual disturbance.  Respiratory: Positive for shortness of breath. Negative for cough.   Cardiovascular: Positive for chest pain and palpitations.  Gastrointestinal: Negative for abdominal pain and vomiting.  Genitourinary: Negative for dysuria and hematuria.  Musculoskeletal: Negative for arthralgias and back pain.  Skin: Negative for color change and rash.  Neurological: Positive for headaches. Negative for seizures and syncope.  All other systems reviewed and are negative.  Physical Exam Updated Vital Signs BP (!) 181/100 (BP Location: Left Arm)   Pulse 83   Temp 98.3 F (36.8 C) (Oral)   Resp 18   Ht 5\' 6"  (1.676 m)   Wt 88.5 kg (195 lb)   LMP 05/08/2017   SpO2 100%   BMI 31.47 kg/m   Physical Exam  Constitutional: She appears well-developed and well-nourished. No distress.  HENT:  Head: Normocephalic and atraumatic.  Eyes: Conjunctivae are normal.  Neck: Neck supple.  Cardiovascular: Normal rate and regular rhythm.   No murmur heard. Pulmonary/Chest: Effort normal and breath sounds normal. No respiratory distress.  Abdominal: Soft. There is no tenderness.  Musculoskeletal: She exhibits edema (minimal bilateral lower extremities).  Neurological: She is alert.  Skin: Skin is warm and dry.  Psychiatric: She has a normal mood and affect.  Nursing note and vitals reviewed.  ED  Treatments / Results  Labs (all labs ordered are listed, but only abnormal results are displayed) Labs Reviewed  BASIC METABOLIC PANEL - Abnormal; Notable for the following:       Result Value   Glucose, Bld 109 (*)    All other components within normal limits  CBC  I-STAT TROPONIN, ED   EKG  EKG Interpretation  Date/Time:  Monday June 04 2017 13:22:18 EDT Ventricular Rate:  75 PR Interval:  192 QRS Duration: 80 QT Interval:  430 QTC Calculation: 480 R Axis:   36 Text Interpretation:  Normal sinus rhythm Prolonged QT Abnormal ECG no acute changes  Confirmed by Crista Curb [id number]) on 06/04/2017 5:49:43 PM      Radiology Dg Chest 2 View  Result Date: 06/04/2017 CLINICAL DATA:  Shortness of breath with cardiac palpitations. Chest pain. EXAM: CHEST  2 VIEW COMPARISON:  January 21, 2013 FINDINGS: Lungs are clear. Heart size and  pulmonary vascularity are normal. No adenopathy. No pneumothorax. There is postoperative change in the lower cervical spine. IMPRESSION: No edema or consolidation. Electronically Signed   By: Bretta BangWilliam  Woodruff III M.D.   On: 06/04/2017 14:15   Procedures Procedures (including critical care time)  Medications Ordered in ED Medications  methocarbamol (ROBAXIN) tablet 750 mg (750 mg Oral Given 06/04/17 2020)   Initial Impression / Assessment and Plan / ED Course  I have reviewed the triage vital signs and the nursing notes.  Pertinent labs & imaging results that were available during my care of the patient were reviewed by me and considered in my medical decision making (see chart for details).  Upon my evaluation patient patient endorses atypical chest pain associated with palpitations today. Symptomatic treatment with nitroglycerin was not required as she was not actively having chest pain on my evaluation   EKG I obtained reveals no anatomical ischemia representing STEMI, New-onset Arrhythmia, or ischemic equivalent. Therefore do not suspect ACS at this  time. No concerns for Pericardial Tamponade on EKG and in light of patients hemodynamic stability. No pain related to supine or prone positions and given EKG doubt Pericarditis.   Per the patient's absence of CAD will send Troponin. First of which is negative, Repeat troponin at 3 hour interval was also negative. Therefore also do not suspect Myocarditis.   Lab studies: BMP within normal limits, no acute electrolyte derangement to explains feeling of palpitations, CBC within normal limits, trop x2 negative. BNP mildly elevated however do not consider significant as she has not had marked SOB, orthopnea, or marked lower extremity swelling, she does not otherwise exhibits signs or symptoms concerning for heart failure.   CXR unremarkable for focal airspace disease, patient is afebrile, cough, and WBC shows no leukocytosis, do not suspect Pneumonia. Without evidence of Pneumothorax. CXR without concern for Esophageal Tear and there is no recent intractable emesis or esophageal instrumentation.  No peritonitis or free air on CXR worrisome for Perforated Abdominal Viscous.  Pain is not described as tearing and does not radiate to back, doubt Aortic Dissection. Pulses present bilaterally in upper and lower extremities. CXR does not show widened mediastinum.  My H&P and patient's clinical course is not currently consistent with any of the above emergency medical conditions at this time. At this time discussed likely etiology of symptoms with the patient and advised her to follow up with her PCP for possible prolonged heart monitoring. She is in agreement with the plan and currently asymptomatic. I discussed discharged home with the patient who at this time feels safe and stable for discharge. She was given strict return precautions for any new, worsening or returning symptoms. Discharged in stable condition.    Final Clinical Impressions(s) / ED Diagnoses   Final diagnoses:  Heart palpitations    New  Prescriptions New Prescriptions   No medications on file     Lamont SnowballGarza, Yuriko Portales, MD 06/06/17 1001    Lavera GuiseLiu, Dana Duo, MD 06/07/17 [phone]

## 2017-06-04 NOTE — ED Notes (Signed)
Pt stable, ambulatory, states understanding of discharge instructions 

## 2017-06-04 NOTE — ED Notes (Signed)
Pt was offered a wheelchair to come back to room, pt did not want one pt walked back to the room.

## 2017-06-04 NOTE — ED Triage Notes (Signed)
Pt reports episode today while at work of n/v, palpitations, sob and chest pain. Still has chest discomfort and feels like heart is racing on arrival to ED. ekg done at triage.

## 2017-06-05 NOTE — ED Provider Notes (Signed)
I saw and evaluated the patient, reviewed the resident's note and I agree with the findings and plan.   EKG Interpretation  Date/Time:  Monday June 04 2017 13:22:18 EDT Ventricular Rate:  75 PR Interval:  192 QRS Duration: 80 QT Interval:  430 QTC Calculation: 480 R Axis:   36 Text Interpretation:  Normal sinus rhythm Prolonged QT Abnormal ECG no acute changes  Confirmed by Crista CurbLiu, Hollee Fate [phone] on 06/04/2017 5:49:6643 PM      51 year old female who presents with chest pain and palpitations. Reports onset today of palpitations followed by nausea vomiting, shortness of breath, and left scapular and left sided chest pain. States that she had infrequent episodes of the same symptoms before in the past. Has been seen by Dr. Herbie BaltimoreHarding from cardiology as an outpatient. She reports that she has had a reassuring stress testing and unremarkable Holter monitoring as an outpatient. On arrival to the ED, her symptoms had improved.Denies any leg swellingor calf tenderness, history of PE/DVT or strong family history of thromboembolic disease, exogenous hormones, or recent immobilization. Pain is not pleuritic. States that it does seem worse when she is sitting up but not worse with other movements or exertion/activity.  She is well-appearing in no acute distress. Her vital signs are stable. Her EKG is not acutely ischemic. Serial troponins are normal. Chest x-ray visualized shows no acute cardiopulmonary processes. Remainder of her blood work is reassuring. Doubt PE or dissection. Heart score of 3, and felt at this time to be adequately ruled out for ACS. She will call Dr. Herbie BaltimoreHarding, and discuss additional workup of her symptoms. Strict return and follow-up instructions reviewed. She expressed understanding of all discharge instructions and felt comfortable with the plan of care.     Lavera GuiseLiu, Jie Stickels Duo, MD 06/05/17 [phone]

## 2017-06-25 DIAGNOSIS — Z6832 Body mass index (BMI) 32.0-32.9, adult: Secondary | ICD-10-CM | POA: Diagnosis not present

## 2017-06-25 DIAGNOSIS — Z01419 Encounter for gynecological examination (general) (routine) without abnormal findings: Secondary | ICD-10-CM | POA: Diagnosis not present

## 2017-06-25 DIAGNOSIS — Z1231 Encounter for screening mammogram for malignant neoplasm of breast: Secondary | ICD-10-CM | POA: Diagnosis not present

## 2017-06-27 ENCOUNTER — Ambulatory Visit (INDEPENDENT_AMBULATORY_CARE_PROVIDER_SITE_OTHER): Payer: BLUE CROSS/BLUE SHIELD | Admitting: Cardiology

## 2017-06-27 ENCOUNTER — Encounter: Payer: Self-pay | Admitting: Cardiology

## 2017-06-27 VITALS — BP 192/103 | HR 81 | Ht 66.0 in | Wt 197.0 lb

## 2017-06-27 DIAGNOSIS — R002 Palpitations: Secondary | ICD-10-CM | POA: Diagnosis not present

## 2017-06-27 DIAGNOSIS — M7989 Other specified soft tissue disorders: Secondary | ICD-10-CM

## 2017-06-27 DIAGNOSIS — E785 Hyperlipidemia, unspecified: Secondary | ICD-10-CM

## 2017-06-27 DIAGNOSIS — I10 Essential (primary) hypertension: Secondary | ICD-10-CM | POA: Diagnosis not present

## 2017-06-27 DIAGNOSIS — E669 Obesity, unspecified: Secondary | ICD-10-CM | POA: Diagnosis not present

## 2017-06-27 MED ORDER — AMLODIPINE BESYLATE 10 MG PO TABS: 10.0000 mg | ORAL_TABLET | Freq: Every evening | ORAL | 3 refills | Status: DC

## 2017-06-27 NOTE — Patient Instructions (Signed)
MEDICATION CHANGES   START TAKING AMLODIPINE 10 MG  DAILY ( BEGIN TODAY)   KEEP A RECORD OF BLOOD PRESSURES AND BRING TO NEXT APPOINTMENT.    Your physician recommends that you schedule a follow-up appointment in 2 WEEKS WITH CV[facility] -- BLOOD PRESSURE    Your physician recommends that you schedule a follow-up appointment in 3 MONTHS WITH DR HARDING.   If you need a refill on your cardiac medications before your next appointment, please call your pharmacy.

## 2017-06-27 NOTE — Progress Notes (Addendum)
PCP: Assunta Found, MD  Clinic Note: Chief Complaint  Patient presents with  . Hospitalization Follow-up    Chest pain, palpitations  . Follow-up    Due for annual follow-up  . Hypertension    Accelerated.  Out of medication    HPI: [name] is a 51 y.o. female with a PMH below who presents today for annual follow-up/hospital follow-up. She also has hypertension and hyperlipidemia.  [name] was last seen in October 2017  Recent Hospitalizations:   ER visit June 04, 2017 -> she noted chest pain and palpitations that began earlier that day.  Her symptoms improved upon arrival to the ER.  She described 5 out of 10 sharp substernal pain in the substernal area and lateral part of her chest.  No radiation.  She indicated to me that she went in shortly having an episode of nausea and emesis that was then followed by shortness of breath and chest discomfort as well as heart palpitations.  She ruled out for MI.  Studies Personally Reviewed - (if available, images/films reviewed: From Epic Chart or Care Everywhere)  No new studies  Interval History: [name] returns here today overall feeling somewhat better.  She notes that she ran out of her calcium channel blocker about 3 months ago and never really was able to get a refill.  Apparently when she went to the pharmacy they told her that she was not taking the right dose and it was not refilled.  Unfortunately as a result her heart rate and blood pressure been going up and down of late.  She has not had any more symptoms that she had in the emergency room.  She sheepishly acknowledges that she probably should have contacted [place] earlier. She has not had any further episodes of chest tightness or pressure with rest or exertion.  However, when her blood pressure is high she does feel a little sense of heaviness and has a headache with with a little bit of vision issues. Currently she feels a little weak, but is not  having any chest pain or dyspnea. She has had no PND orthopnea.  Trivial edema.  A little bit of lightheadedness and dizziness but no syncope or near syncope.  No TIA or amaurosis fugax symptoms.  No claudication.  ROS: A comprehensive was performed. Review of Systems  Constitutional: Positive for malaise/fatigue. Negative for weight loss.  HENT: Negative for congestion and nosebleeds.   Eyes: Positive for blurred vision.       As noted in HPI  Respiratory:       Per HPI  Cardiovascular:       Per HPI  Gastrointestinal: Negative for blood in stool and melena.  Musculoskeletal: Negative for joint pain.  Neurological: Positive for dizziness and headaches.  Endo/Heme/Allergies: Negative for environmental allergies.  Psychiatric/Behavioral: Negative for depression and memory loss. The patient does not have insomnia.   All other systems reviewed and are negative.   I have reviewed and (if needed) personally updated the patient's problem list, medications, allergies, past medical and surgical history, social and family history.   Past Medical History:  Diagnosis Date  . Constipation - functional   . Ectopic pregnancy   . Essential hypertension   . Hyperlipidemia LDL goal <130   . Obesity   . Palpitations    Normal echocardiogram with no valvular lesions. Normal nuclear stress test in March 2011 and August 2014  . Stress incontinence   . Thyroid disease  isolated, one month only, not on synthroid since 2013    Past Surgical History:  Procedure Laterality Date  . CardioNet Monitor  July 2014    Sinus rhythm, sinus tachycardia.  Shortness of breath, dizziness, and chest pain noted with sinus tachycardia  . CHOLECYSTECTOMY    . COLONOSCOPY N/A 02/06/2014   Performed by West BaliFields, Sandi L, MD at [facility]  . DOPPLER ECHOCARDIOGRAPHY  March 2011; July 2014   Normal EF, essentially normal; no valvular lesions.  No PFO or ASD  . LAPAROSCOPIC ENDOMETRIOSIS FULGURATION  1990  .  Treadmill Myoview Nuclear Stress Test  March 2011, August 2014   a) 2011: 6 minutes, 7 METS; Diaphragmatic attenuation otherwise no evidence of ischemia or infarction; b) 2014: Walked at 9 min, 10.1 METs, no ischemia or infarction with normal EF 66%. No EKG changes.    Current Meds  Medication Sig  . diphenhydrAMINE (BENADRYL) 25 MG tablet Take 25 mg by mouth every 6 (six) hours as needed.  . hydrochlorothiazide (MICROZIDE) 12.5 MG capsule Take 12.5 mg daily as needed by mouth.   . metoprolol succinate (TOPROL-XL) 50 MG 24 hr tablet TAKE 1 TABLET BY MOUTH EVERY DAY WITH FOOD OR IMMEDIATELY FOLLOWING A MEAL  . olmesartan (BENICAR) 40 MG tablet Take 1 tablet (40 mg total) by mouth every morning.  . potassium chloride SA (KLOR-CON M20) 20 MEQ tablet Take 1 tablet (20 mEq total) by mouth daily.  . traMADol (ULTRAM) 50 MG tablet Take 3 (three) times daily as needed by mouth for moderate pain.    Allergies  Allergen Reactions  . Doxycycline Itching and Rash    Social History   Socioeconomic History  . Marital status: Married    Spouse name: None  . Number of children: 2  . Years of education: College  . Highest education level: None  Social Needs  . Financial resource strain: None  . Food insecurity - worry: None  . Food insecurity - inability: None  . Transportation needs - medical: None  . Transportation needs - non-medical: None  Occupational History  . Occupation: Nutritional therapistCustomer service    Employer: OTHER    Comment: [employer]  Tobacco Use  . Smoking status: Never Smoker  . Smokeless tobacco: Never Used  Substance and Sexual Activity  . Alcohol use: No    Alcohol/week: 0.0 oz  . Drug use: No  . Sexual activity: No  Other Topics Concern  . None  Social History Narrative   Remain since her last visit. Her husband and is [name].      Is now working on exercising regularly doing walks for 45 to 60 minutes a day 5 days a week.    family history includes Heart disease in her  mother; Hypertension in her father and mother.  Wt Readings from Last 3 Encounters:  06/27/17 197 lb (89.4 kg)  06/04/17 195 lb (88.5 kg)  06/07/16 210 lb 3.2 oz (95.3 kg)    PHYSICAL EXAM BP (!) 192/103   Pulse 81   Ht 5\' 6"  (1.676 m)   Wt 197 lb (89.4 kg)   BMI 31.80 kg/m  Physical Exam  Constitutional: She is oriented to person, place, and time. She appears well-developed and well-nourished. No distress (Just a little bit shy and upset).  HENT:  Head: Normocephalic and atraumatic.  Neck: No hepatojugular reflux and no JVD present. Carotid bruit is not present.  Cardiovascular: Normal rate, regular rhythm and normal pulses.  No extrasystoles are present.  Exam reveals no gallop and no friction rub.  Murmur heard.  Medium-pitched harsh early systolic murmur is present with a grade of 1/6 at the upper right sternal border and lower left sternal border. Pulmonary/Chest: Effort normal and breath sounds normal. No respiratory distress. She has no wheezes.  Abdominal: Soft. Bowel sounds are normal. She exhibits no distension. There is no tenderness. There is no rebound.  Musculoskeletal: Normal range of motion. She exhibits no edema or deformity.  Neurological: She is alert and oriented to person, place, and time.  Skin: Skin is warm and dry.  Psychiatric: She has a normal mood and affect. Her behavior is normal. Judgment and thought content normal.  Nursing note and vitals reviewed.   Adult ECG Report Not checked  Other studies Reviewed: Additional studies/ records that were reviewed today include:  Recent Labs:    Lab Results  Component Value Date   CREATININE 1.00 06/04/2017   BUN 8 06/04/2017   NA 140 06/04/2017   K 3.7 06/04/2017   CL 107 06/04/2017   CO2 26 06/04/2017  No results found for: CHOL, HDL, LDLCALC, LDLDIRECT, TRIG, CHOLHDL  ASSESSMENT / PLAN: Problem List Items Addressed This Visit    Accelerated hypertension - Primary    Symptomatic accelerated  hypertension with chest pain and palpitations.  Unfortunately this was in the setting of not being on appropriate medications.  I explained to her that simply not taking medication for several months is not a good answer.  She should call the office to find out if she truly is not supposed to be taking a medication that she is always been on.  If there is an issue with the pharmacy, we can handle it. Plan: Restart amlodipine at 10 mg daily.  -Blood pressure for 2 weeks - then f/u with our pharmacist team in our [facility] ([facility]). -Additional options include converting from HCTZ to chlorthalidone and potentially converting from Toprol to either Bystolic or carvedilol       Relevant Medications   hydrochlorothiazide (MICROZIDE) 12.5 MG capsule   amLODipine (NORVASC) 10 MG tablet   Essential hypertension (Chronic)    Had previously been well controlled on her current medications even off of HCTZ.  I think this is probably all because of her not being on the amlodipine. I do want her to also take the HCTZ as well for now.  --Follow-up with CV [facility]      Relevant Medications   hydrochlorothiazide (MICROZIDE) 12.5 MG capsule   amLODipine (NORVASC) 10 MG tablet   Hyperlipidemia with target LDL less than 130 (Chronic)    Back on pravastatin.  Did not really seem to have much to do with cramping.  Labs followed by PCP.      Relevant Medications   hydrochlorothiazide (MICROZIDE) 12.5 MG capsule   amLODipine (NORVASC) 10 MG tablet   Obesity (BMI 30-39.9) (Chronic)    She has actually lost weight (13 pounds down from her last visit here).  She has been watching her diet and trying to exercise.  Unfortunately she just ran out of her medication.      Palpitations (Chronic)    Have been well controlled with beta-blocker.  But when her blood pressure is elevated, she still feels palpitations.      Swelling of both lower and upper extremities (Chronic)    She really does  not seem to think that her edema is all that bad now.  Mostly until we get her blood pressure  control, I would keep her on her diuretic.  Need to monitor for cramping however.  Most does not seem to work like he does for most people.  Need to ensure that the Potassium levels are stable.         Current medicines are reviewed at length with the patient today. (+/- concerns) - needs refill of meds. The following changes have been made: = see below   Patient Instructions  MEDICATION CHANGES   START TAKING AMLODIPINE 10 MG  DAILY ( BEGIN TODAY)   KEEP A RECORD OF BLOOD PRESSURES AND BRING TO NEXT APPOINTMENT.    Your physician recommends that you schedule a follow-up appointment in 2 WEEKS WITH [facility] -- BLOOD PRESSURE    Your physician recommends that you schedule a follow-up appointment in 3 MONTHS WITH DR Kimerly Rowand.   If you need a refill on your cardiac medications before your next appointment, please call your pharmacy.       Studies Ordered:   No orders of the defined types were placed in this encounter.     Bryan Lemmaavid Ikaika Showers, M.D., M.S. Interventional Cardiologist   Pager # [phone] Phone # [phone] [address] [place], [place] [address]

## 2017-06-29 ENCOUNTER — Encounter: Payer: Self-pay | Admitting: Cardiology

## 2017-06-30 ENCOUNTER — Encounter: Payer: Self-pay | Admitting: Cardiology

## 2017-06-30 NOTE — Assessment & Plan Note (Signed)
Back on pravastatin.  Did not really seem to have much to do with cramping.  Labs followed by PCP.

## 2017-06-30 NOTE — Assessment & Plan Note (Signed)
Have been well controlled with beta-blocker.  But when her blood pressure is elevated, she still feels palpitations.

## 2017-06-30 NOTE — Assessment & Plan Note (Signed)
Symptomatic accelerated hypertension with chest pain and palpitations.  Unfortunately this was in the setting of not being on appropriate medications.  I explained to her that simply not taking medication for several months is not a good answer.  She should call the office to find out if she truly is not supposed to be taking a medication that she is always been on.  If there is an issue with the pharmacy, we can handle it. Plan: Restart amlodipine at 10 mg daily.  -Blood pressure for 2 weeks - then f/u with our pharmacist team in our [facility] ([facility]). -Additional options include converting from HCTZ to chlorthalidone and potentially converting from Toprol to either Bystolic or carvedilol

## 2017-06-30 NOTE — Assessment & Plan Note (Signed)
She really does not seem to think that her edema is all that bad now.  Mostly until we get her blood pressure control, I would keep her on her diuretic.  Need to monitor for cramping however.  Most does not seem to work like he does for most people.  Need to ensure that the Potassium levels are stable.

## 2017-06-30 NOTE — Assessment & Plan Note (Signed)
Had previously been well controlled on her current medications even off of HCTZ.  I think this is probably all because of her not being on the amlodipine. I do want her to also take the HCTZ as well for now.  --Follow-up with CV [facility]

## 2017-06-30 NOTE — Assessment & Plan Note (Signed)
She has actually lost weight (13 pounds down from her last visit here).  She has been watching her diet and trying to exercise.  Unfortunately she just ran out of her medication.

## 2017-07-03 DIAGNOSIS — Z1389 Encounter for screening for other disorder: Secondary | ICD-10-CM | POA: Diagnosis not present

## 2017-07-03 DIAGNOSIS — E6609 Other obesity due to excess calories: Secondary | ICD-10-CM | POA: Diagnosis not present

## 2017-07-03 DIAGNOSIS — E039 Hypothyroidism, unspecified: Secondary | ICD-10-CM | POA: Diagnosis not present

## 2017-07-03 DIAGNOSIS — Z6832 Body mass index (BMI) 32.0-32.9, adult: Secondary | ICD-10-CM | POA: Diagnosis not present

## 2017-07-03 DIAGNOSIS — R030 Elevated blood-pressure reading, without diagnosis of hypertension: Secondary | ICD-10-CM | POA: Diagnosis not present

## 2017-07-03 DIAGNOSIS — I10 Essential (primary) hypertension: Secondary | ICD-10-CM | POA: Diagnosis not present

## 2017-07-03 DIAGNOSIS — R42 Dizziness and giddiness: Secondary | ICD-10-CM | POA: Diagnosis not present

## 2017-07-12 ENCOUNTER — Ambulatory Visit (INDEPENDENT_AMBULATORY_CARE_PROVIDER_SITE_OTHER): Payer: BLUE CROSS/BLUE SHIELD | Admitting: Pharmacist

## 2017-07-12 VITALS — BP 136/88 | HR 76

## 2017-07-12 DIAGNOSIS — I10 Essential (primary) hypertension: Secondary | ICD-10-CM | POA: Diagnosis not present

## 2017-07-12 NOTE — Progress Notes (Signed)
Patient ID: [name]                 DOB: 09/04/1965                      MRN: [id number]     HPI: [name] is a 51 y.o. female referred by Dr. Herbie BaltimoreHarding to HTN clinic. PMH includes hypertension, hyperlipidemia, obesity, palpitations, ans thyroid disorder. Patient was diagnosed with HTN 25 years ago. Stopped taking medication after running out of refills and unable to refill without f/u with provider. Patient reports feeling better and having good BP readings recently after 3 weeks of resuming all previously stable medication. Denies ADRs, dizziness, swelling or chest pain. Reports some headaches since ER visit due to accelerated hypertension, but much improved in last few weeks.   Current HTN meds:  Olmesartan 40mg  daily Amlodipine 10mg  daily HCTZ 12.5mg  daily Metoprolol succinate 50mg  daily  Previously tried:  Losartan.hctz 100-25mg  daily  BP goal: 130/80  Family History: Heart disease in her mother; Hypertension in her father and mother.  Social History: denies tobacco and alcohol use  Diet: low sodium diet  Home BP readings: 10 readings; average 141/92 (pulse 80-113 bpm)  Wt Readings from Last 3 Encounters:  06/27/17 197 lb (89.4 kg)  06/04/17 195 lb (88.5 kg)  06/07/16 210 lb 3.2 oz (95.3 kg)   BP Readings from Last 3 Encounters:  07/12/17 136/88  06/27/17 (!) 192/103  06/04/17 126/65   Pulse Readings from Last 3 Encounters:  07/12/17 76  06/27/17 81  06/04/17 71    Past Medical History:  Diagnosis Date  . Constipation - functional   . Ectopic pregnancy   . Essential hypertension   . Hyperlipidemia LDL goal <130   . Obesity   . Palpitations    Normal echocardiogram with no valvular lesions. Normal nuclear stress test in March 2011 and August 2014  . Stress incontinence   . Thyroid disease    isolated, one month only, not on synthroid since 2013    Current Outpatient Medications on File Prior to Visit  Medication Sig Dispense Refill    . amLODipine (NORVASC) 10 MG tablet Take 1 tablet (10 mg total) every evening by mouth. 90 tablet 3  . diphenhydrAMINE (BENADRYL) 25 MG tablet Take 25 mg by mouth every 6 (six) hours as needed.    . hydrochlorothiazide (MICROZIDE) 12.5 MG capsule Take 12.5 mg daily as needed by mouth.     . metoprolol succinate (TOPROL-XL) 50 MG 24 hr tablet TAKE 1 TABLET BY MOUTH EVERY DAY WITH FOOD OR IMMEDIATELY FOLLOWING A MEAL 90 tablet 3  . olmesartan (BENICAR) 40 MG tablet Take 1 tablet (40 mg total) by mouth every morning. 90 tablet 3  . potassium chloride SA (KLOR-CON M20) 20 MEQ tablet Take 1 tablet (20 mEq total) by mouth daily. 90 tablet 3  . traMADol (ULTRAM) 50 MG tablet Take 3 (three) times daily as needed by mouth for moderate pain.     No current facility-administered medications on file prior to visit.     Allergies  Allergen Reactions  . Doxycycline Itching and Rash    Blood pressure 136/88, pulse 76, SpO2 95 %.  Accelerated hypertension Blood pressure well controlled today. Patient is feeling better since able to resume previously stable medication. She continues to have some headaches since ER visit with elevated BP but much improved since. Will continue all current medication without changes and follow up at HTN  clinic as needed. Patient was instructed to monitor BP 2-3 per week for next few weeks and contact clinic is problems of questions.    Zakhia Seres Rodriguez-Guzman PharmD, BCPS, CPP [facility] [address] [place] [id number] 07/17/2017 8:23 PM

## 2017-07-12 NOTE — Patient Instructions (Addendum)
Return for a  follow up appointment as needed  Your blood pressure today is 136/88 pulse 76  Check your blood pressure at home daily (if able) and keep record of the readings.  Take your BP meds as follows: **All medication as previously prescribed**  Bring all of your meds, your BP cuff and your record of home blood pressures to your next appointment.  Exercise as you're able, try to walk approximately 30 minutes per day.  Keep salt intake to a minimum, especially watch canned and prepared boxed foods.  Eat more fresh fruits and vegetables and fewer canned items.  Avoid eating in fast food restaurants.    HOW TO TAKE YOUR BLOOD PRESSURE: . Rest 5 minutes before taking your blood pressure. .  Don't smoke or drink caffeinated beverages for at least 30 minutes before. . Take your blood pressure before (not after) you eat. . Sit comfortably with your back supported and both feet on the floor (don't cross your legs). . Elevate your arm to heart level on a table or a desk. . Use the proper sized cuff. It should fit smoothly and snugly around your bare upper arm. There should be enough room to slip a fingertip under the cuff. The bottom edge of the cuff should be 1 inch above the crease of the elbow. . Ideally, take 3 measurements at one sitting and record the average.

## 2017-07-17 ENCOUNTER — Encounter: Payer: Self-pay | Admitting: Pharmacist

## 2017-07-17 NOTE — Assessment & Plan Note (Signed)
Blood pressure well controlled today. Patient is feeling better since able to resume previously stable medication. She continues to have some headaches since ER visit with elevated BP but much improved since. Will continue all current medication without changes and follow up at HTN clinic as needed. Patient was instructed to monitor BP 2-3 per week for next few weeks and contact clinic is problems of questions.

## 2017-08-08 ENCOUNTER — Other Ambulatory Visit: Payer: Self-pay

## 2017-08-08 MED ORDER — POTASSIUM CHLORIDE CRYS ER 20 MEQ PO TBCR: 20.0000 meq | EXTENDED_RELEASE_TABLET | Freq: Every day | ORAL | 3 refills | Status: DC

## 2017-08-26 ENCOUNTER — Other Ambulatory Visit: Payer: Self-pay | Admitting: Cardiology

## 2017-08-27 NOTE — Telephone Encounter (Signed)
Rx(s) sent to pharmacy electronically.  

## 2017-09-25 ENCOUNTER — Ambulatory Visit: Payer: BLUE CROSS/BLUE SHIELD | Admitting: Cardiology

## 2017-10-01 ENCOUNTER — Ambulatory Visit (INDEPENDENT_AMBULATORY_CARE_PROVIDER_SITE_OTHER): Payer: BLUE CROSS/BLUE SHIELD | Admitting: Cardiology

## 2017-10-01 ENCOUNTER — Encounter: Payer: Self-pay | Admitting: Cardiology

## 2017-10-01 VITALS — BP 132/87 | HR 77 | Ht 65.5 in | Wt 193.8 lb

## 2017-10-01 DIAGNOSIS — I10 Essential (primary) hypertension: Secondary | ICD-10-CM

## 2017-10-01 DIAGNOSIS — M7989 Other specified soft tissue disorders: Secondary | ICD-10-CM | POA: Diagnosis not present

## 2017-10-01 DIAGNOSIS — R011 Cardiac murmur, unspecified: Secondary | ICD-10-CM | POA: Diagnosis not present

## 2017-10-01 DIAGNOSIS — E785 Hyperlipidemia, unspecified: Secondary | ICD-10-CM

## 2017-10-01 DIAGNOSIS — R002 Palpitations: Secondary | ICD-10-CM | POA: Diagnosis not present

## 2017-10-01 MED ORDER — SPIRONOLACTONE 25 MG PO TABS: 25.0000 mg | ORAL_TABLET | Freq: Every day | ORAL | 6 refills | Status: DC | PRN

## 2017-10-01 NOTE — Progress Notes (Signed)
PCP: Assunta FoundGolding, John, MD  Clinic Note: Chief Complaint  Patient presents with  . Follow-up    No major complaints, mild swelling  . Hypertension    Symptomatic accelerated hypertension    HPI: [name] is a 52 y.o. female with a PMH below who presents today for annual follow-up/hospital follow-up. She also has hypertension and hyperlipidemia.  [name] was last seen in October 2017  Recent Hospitalizations:   ER visit June 04, 2017 -> she noted chest pain and palpitations that began earlier that day.  Her symptoms improved upon arrival to the ER.  She described 5 out of 10 sharp substernal pain in the substernal area and lateral part of her chest.  No radiation.  She indicated to me that she went in shortly having an episode of nausea and emesis that was then followed by shortness of breath and chest discomfort as well as heart palpitations.  She ruled out for MI.  Studies Personally Reviewed - (if available, images/films reviewed: From Epic Chart or Care Everywhere)  No new studies  Interval History: [name] returns here today overall feeling notably better.  She has not had any further episodes of chest tightness or dyspnea with rest or exertion.  No headache or blurred vision episodes.  She says that her swelling is still there near mid day comes on and she is reluctant to take her HCTZ because of cramping.  She takes it may be every other day.  Otherwise her blood pressures have been well controlled.  She has not had any rapid irregular heartbeats or palpitations.  No sensation of feeling weak or tired.  No syncope/near syncope or TIA/amaurosis fugax symptoms.  No claudication symptoms.  She notes that being on her blood pressure medications overall makes her feel much better.  ROS: A comprehensive was performed. Review of Systems  Constitutional: Negative for malaise/fatigue and weight loss.  HENT: Negative for congestion and nosebleeds.   Eyes:  Negative for blurred vision.       As noted in HPI  Respiratory: Negative for cough and shortness of breath.   Cardiovascular: Positive for leg swelling.       Per HPI  Gastrointestinal: Negative for blood in stool and melena.  Genitourinary: Negative for hematuria.  Musculoskeletal: Negative for joint pain.       Cramps, made worse with diuretic  Neurological: Negative for dizziness and headaches.  Endo/Heme/Allergies: Negative for environmental allergies.  Psychiatric/Behavioral: Negative for depression and memory loss. The patient does not have insomnia.   All other systems reviewed and are negative.   I have reviewed and (if needed) personally updated the patient's problem list, medications, allergies, past medical and surgical history, social and family history.   Past Medical History:  Diagnosis Date  . Constipation - functional   . Ectopic pregnancy   . Essential hypertension   . Hyperlipidemia LDL goal <130   . Obesity   . Palpitations    Normal echocardiogram with no valvular lesions. Normal nuclear stress test in March 2011 and August 2014  . Stress incontinence   . Thyroid disease    isolated, one month only, not on synthroid since 2013    Past Surgical History:  Procedure Laterality Date  . CardioNet Monitor  July 2014    Sinus rhythm, sinus tachycardia.  Shortness of breath, dizziness, and chest pain noted with sinus tachycardia  . CHOLECYSTECTOMY    . COLONOSCOPY N/A 02/06/2014   SLF:1. Normal mucosa in the terrminal  ileum 2. The LEFT colon is redundant 3. The colon mucosa was otherwise normal 4. Moderate sizxed internal hemorrhoids 5. No source for change in the bowel habits identified. constipation most likely due to functional constipation.   [facility] ECHOCARDIOGRAPHY  March 2011; July 2014   Normal EF, essentially normal; no valvular lesions.  No PFO or ASD  . LAPAROSCOPIC ENDOMETRIOSIS FULGURATION  1990  . Treadmill Myoview Nuclear Stress Test  March 2011,  August 2014   a) 2011: 6 minutes, 7 METS; Diaphragmatic attenuation otherwise no evidence of ischemia or infarction; b) 2014: Walked at 9 min, 10.1 METs, no ischemia or infarction with normal EF 66%. No EKG changes.    Current Meds  Medication Sig  . amLODipine (NORVASC) 10 MG tablet Take 1 tablet (10 mg total) every evening by mouth.  . diphenhydrAMINE (BENADRYL) 25 MG tablet Take 25 mg by mouth every 6 (six) hours as needed.  . hydrochlorothiazide (MICROZIDE) 12.5 MG capsule Take 12.5 mg daily as needed by mouth.   . metoprolol succinate (TOPROL-XL) 50 MG 24 hr tablet Take 1 tablet (50 mg total) by mouth daily.  [name] Kitchen olmesartan (BENICAR) 40 MG tablet Take 1 tablet (40 mg total) by mouth every morning.  . potassium chloride SA (KLOR-CON M20) 20 MEQ tablet Take 1 tablet (20 mEq total) by mouth daily.  . traMADol (ULTRAM) 50 MG tablet Take 3 (three) times daily as needed by mouth for moderate pain.    Allergies  Allergen Reactions  . Doxycycline Itching and Rash    Social History   Socioeconomic History  . Marital status: Married    Spouse name: None  . Number of children: 2  . Years of education: College  . Highest education level: None  Social Needs  . Financial resource strain: None  . Food insecurity - worry: None  . Food insecurity - inability: None  . Transportation needs - medical: None  . Transportation needs - non-medical: None  Occupational History  . Occupation: Nutritional therapist: [employer]    Comment: [name]  Tobacco Use  . Smoking status: Never Smoker  . Smokeless tobacco: Never Used  Substance and Sexual Activity  . Alcohol use: No    Alcohol/week: 0.0 oz  . Drug use: No  . Sexual activity: No  Other Topics Concern  . None  Social History Narrative   Remain since her last visit. Her husband and is [name].      Is now working on exercising regularly doing walks for 45 to 60 minutes a day 5 days a week.    family history includes Heart  disease in her mother; Hypertension in her father and mother.  Wt Readings from Last 3 Encounters:  10/01/17 193 lb 12.8 oz (87.9 kg)  06/27/17 197 lb (89.4 kg)  06/04/17 195 lb (88.5 kg)    PHYSICAL EXAM BP 132/87   Pulse 77   Ht 5' 5.5" (1.664 m)   Wt 193 lb 12.8 oz (87.9 kg)   BMI 31.76 kg/m  Physical Exam  Constitutional: She is oriented to person, place, and time. She appears well-developed and well-nourished. No distress (Just a little bit shy and upset).  HENT:  Head: Normocephalic and atraumatic.  Neck: No hepatojugular reflux and no JVD present. Carotid bruit is not present.  Cardiovascular: Normal rate, regular rhythm and normal pulses.  No extrasystoles are present. Exam reveals no gallop and no friction rub.  Murmur heard.  Medium-pitched harsh early systolic murmur  is present with a grade of 1/6 at the upper right sternal border and lower left sternal border. Pulmonary/Chest: Effort normal and breath sounds normal. No respiratory distress. She has no wheezes.  Abdominal: Soft. Bowel sounds are normal. She exhibits no distension. There is no tenderness. There is no rebound.  Musculoskeletal: Normal range of motion. She exhibits no edema.  Neurological: She is alert and oriented to person, place, and time.  Psychiatric: She has a normal mood and affect. Her behavior is normal. Judgment and thought content normal.  Nursing note and vitals reviewed.   Adult ECG Report Not checked  Other studies Reviewed: Additional studies/ records that were reviewed today include:  Recent Labs:    Lab Results  Component Value Date   CREATININE 1.00 06/04/2017   BUN 8 06/04/2017   NA 140 06/04/2017   K 3.7 06/04/2017   CL 107 06/04/2017   CO2 26 06/04/2017  No results found for: CHOL, HDL, LDLCALC, LDLDIRECT, TRIG, CHOLHDL  ASSESSMENT / PLAN: Problem List Items Addressed This Visit    Accelerated hypertension    Well-controlled now on multiple medications.  Recent  exacerbation was related to her not being on medications. Continue amlodipine, olmesartan and Toprol.  Using as needed HCTZ. --However will consider switching to as needed spironolactone to avoid hypokalemia      Relevant Medications   pravastatin (PRAVACHOL) 10 MG tablet   spironolactone (ALDACTONE) 25 MG tablet   Essential hypertension (Chronic)    Blood pressure looks good on CV RR follow-up.  Also doing well today.      Relevant Medications   pravastatin (PRAVACHOL) 10 MG tablet   spironolactone (ALDACTONE) 25 MG tablet   Hyperlipidemia with target LDL less than 130 (Chronic)    On pravastatin.  Labs followed by PCP.      Relevant Medications   pravastatin (PRAVACHOL) 10 MG tablet   spironolactone (ALDACTONE) 25 MG tablet   Murmur (Chronic)    Very soft murmur.  Likely innocent flow murmur      Palpitations (Chronic)    Excellent control on metoprolol.  No major complaints now.      Swelling of both lower and upper extremities - Primary (Chronic)    Has been pretty well controlled using intermittent doses of as needed HCTZ.  However this has been complicated by cramps.  I will switch up from HCTZ and give a trial of as needed spironolactone 25 mg.  If this works better, we will simply switch.  Probably related to venous stasis: Order knee-high support stockings      Relevant Orders   Compression stockings      Current medicines are reviewed at length with the patient today. (+/- concerns) - needs refill of meds. The following changes have been made: = see below   Patient Instructions  MEDICATION CHANGES  ---STOP HCTZ ( FOR PRESENT TIME ) AS NEEDED. DO NOT DISCARD MEDICATION .  ---- START  USING SPIRONOLACTONE 25MG   AS NEEDED  FOR EDEMA /SWELLING  FOR THE  NEXT MONTH TO SEE IF THIS HELP WITH LESS CRAMPING.     RECOMMENDATION FOR  YOU TO  PURCHASE  SUPPORT STOCKING   15-20 MMHG KNEE HI .  MAY PURCHASE FROM  MEDICAL SUPPLY, [facility]IFORM STORE, [facility], [facility] ,  PHARMACY , [facility] , [facility] AND BEYOND. NO PRESCRIPTION IS NEEDED.   Your physician wants you to follow-up in 6 MONTH WITH DR HARDING.You will receive a reminder letter in the mail two months  in advance. If you don't receive a letter, please call our office to schedule the follow-up appointment.    If you need a refill on your cardiac medications before your next appointment, please call your pharmacy.      Studies Ordered:   Orders Placed This Encounter  Procedures  . Compression stockings      Bryan Lemma, M.D., M.S. Interventional Cardiologist   Pager # [phone] Phone # [phone] [address] [place], [place] [address]

## 2017-10-01 NOTE — Assessment & Plan Note (Addendum)
Has been pretty well controlled using intermittent doses of as needed HCTZ.  However this has been complicated by cramps.  I will switch up from HCTZ and give a trial of as needed spironolactone 25 mg.  If this works better, we will simply switch.  Probably related to venous stasis: Order knee-high support stockings

## 2017-10-01 NOTE — Assessment & Plan Note (Signed)
Well-controlled now on multiple medications.  Recent exacerbation was related to her not being on medications. Continue amlodipine, olmesartan and Toprol.  Using as needed HCTZ. --However will consider switching to as needed spironolactone to avoid hypokalemia

## 2017-10-01 NOTE — Patient Instructions (Addendum)
MEDICATION CHANGES  ---STOP HCTZ ( FOR PRESENT TIME ) AS NEEDED. DO NOT DISCARD MEDICATION .  ---- START  USING SPIRONOLACTONE 25MG   AS NEEDED  FOR EDEMA /SWELLING  FOR THE  NEXT MONTH TO SEE IF THIS HELP WITH LESS CRAMPING.     RECOMMENDATION FOR  YOU TO  PURCHASE  SUPPORT STOCKING   15-20 MMHG KNEE HI .  MAY PURCHASE FROM  MEDICAL SUPPLY, [facility]ORE, [facility], [facility] , PHARMACY , [facility] , [facility] AND BEYOND. NO PRESCRIPTION IS NEEDED.   Your physician wants you to follow-up in 6 MONTH WITH DR HARDING.You will receive a reminder letter in the mail two months in advance. If you don't receive a letter, please call our office to schedule the follow-up appointment.    If you need a refill on your cardiac medications before your next appointment, please call your pharmacy.

## 2017-10-01 NOTE — Assessment & Plan Note (Addendum)
Blood pressure looks good on CV RR follow-up.  Also doing well today.

## 2017-10-01 NOTE — Assessment & Plan Note (Signed)
Excellent control on metoprolol.  No major complaints now.

## 2017-10-01 NOTE — Assessment & Plan Note (Signed)
On pravastatin.  Labs followed by PCP. 

## 2017-10-01 NOTE — Assessment & Plan Note (Signed)
Very soft murmur.  Likely innocent flow murmur

## 2017-10-15 DIAGNOSIS — N62 Hypertrophy of breast: Secondary | ICD-10-CM | POA: Diagnosis not present

## 2017-10-25 ENCOUNTER — Telehealth: Payer: Self-pay | Admitting: Cardiology

## 2017-10-25 NOTE — Telephone Encounter (Signed)
Follow up   Patient called back disregard she got the medication.

## 2017-10-25 NOTE — Telephone Encounter (Signed)
°*  STAT* If patient is at the pharmacy, call can be transferred to refill team.   1. Which medications need to be refilled? (please list name of each medication and dose if known) Olmesartan 40 mg  2. Which pharmacy/location (including street and city if local pharmacy) is medication to be sent to? [facility] [address], [place], [place] [id number]  3. Do they need a 30 day or 90 day supply?90  Patient would like follow-up once sent in

## 2017-10-26 MED ORDER — OLMESARTAN MEDOXOMIL 40 MG PO TABS: 40.0000 mg | ORAL_TABLET | Freq: Every morning | ORAL | 2 refills | Status: DC

## 2017-11-01 DIAGNOSIS — N6032 Fibrosclerosis of left breast: Secondary | ICD-10-CM | POA: Diagnosis not present

## 2017-11-01 DIAGNOSIS — N62 Hypertrophy of breast: Secondary | ICD-10-CM | POA: Diagnosis not present

## 2017-11-01 DIAGNOSIS — N6031 Fibrosclerosis of right breast: Secondary | ICD-10-CM | POA: Diagnosis not present

## 2017-11-14 ENCOUNTER — Ambulatory Visit (INDEPENDENT_AMBULATORY_CARE_PROVIDER_SITE_OTHER): Payer: BLUE CROSS/BLUE SHIELD

## 2017-11-14 ENCOUNTER — Ambulatory Visit (INDEPENDENT_AMBULATORY_CARE_PROVIDER_SITE_OTHER): Payer: BLUE CROSS/BLUE SHIELD | Admitting: Podiatry

## 2017-11-14 ENCOUNTER — Other Ambulatory Visit: Payer: Self-pay | Admitting: Podiatry

## 2017-11-14 ENCOUNTER — Encounter: Payer: Self-pay | Admitting: Podiatry

## 2017-11-14 DIAGNOSIS — M2011 Hallux valgus (acquired), right foot: Secondary | ICD-10-CM

## 2017-11-14 DIAGNOSIS — M2012 Hallux valgus (acquired), left foot: Secondary | ICD-10-CM

## 2017-11-14 DIAGNOSIS — M21619 Bunion of unspecified foot: Secondary | ICD-10-CM

## 2017-11-14 NOTE — Progress Notes (Signed)
Subjective:   Patient ID: [name], female   DOB: 52 y.o.   MRN: [id number]   HPI Patient presents stating her bunions have really been bothering her and she is tried wider shoes and she is tried other modalities and she just had other surgery done 2 weeks and she is to be out of work 6 weeks and she would like to see if she can get them fixed   ROS      Objective:  Physical Exam  Neurovascular status intact with bumps on the sides of the first metatarsal head bilateral that are red and painful in the area of structural bunion deformity     Assessment:  Structural bunion deformity bilateral with tenderness and pain H&P condition reviewed and recommended     Plan:  Modified McBride type bunionectomies.  I allowed patient to read consent form going over alternative treatments complications patient wants surgery understanding risk and signed consent form.  Patient is scheduled for outpatient surgery in the next several weeks and is given all instructions and is currently encouraged to call with questions  X-ray indicates this is more of structural enlargement of the first metatarsal heads that it is elevation of the intermetatarsal angle and thus will not require osteotomy surgery versus modified Adin HectorMcBride

## 2017-11-14 NOTE — Patient Instructions (Addendum)
Bunion A bunion is a bump on the base of the big toe that forms when the bones of the big toe joint move out of position. Bunions may be small at first, but they often get larger over time. The can make walking painful. What are the causes? A bunion may be caused by:  Wearing narrow or pointed shoes that force the big toe to press against the other toes.  Abnormal foot development that causes the foot to roll inward (pronate).  Changes in the foot that are caused by certain diseases, such as rheumatoid arthritis and polio.  A foot injury.  What increases the risk? The following factors may make you more likely to develop this condition:  Wearing shoes that squeeze the toes together.  Having certain diseases, such as: ? Rheumatoid arthritis. ? Polio. ? Cerebral palsy.  Having family members who have bunions.  Being born with a foot deformity, such as flat feet or low arches.  Doing activities that put a lot of pressure on the feet, such as ballet dancing.  What are the signs or symptoms? The main symptom of a bunion is a noticeable bump on the big toe. Other symptoms may include:  Pain.  Swelling around the big toe.  Redness and inflammation.  Thick or hardened skin on the big toe or between the toes.  Stiffness or loss of motion in the big toe.  Trouble with walking.  How is this diagnosed? A bunion may be diagnosed based on your symptoms, medical history, and activities. You may have tests, such as:  X-rays. These allow your health care provider to check the position of the bones in your foot and look for damage to your joint. They also help your health care provider to determine the severity of your bunion and the best way to treat it.  Joint aspiration. In this test, a sample of fluid is removed from the toe joint. This test, which may be done if you are in a lot of pain, helps to rule out diseases that cause painful swelling of the joints, such as  arthritis.  How is this treated? There is no cure for a bunion, but treatment can help to prevent a bunion from getting worse. Treatment depends on the severity of your symptoms. Your health care provider may recommend:  Wearing shoes that have a wide toe box.  Using bunion pads to cushion the affected area.  Taping your toes together to keep them in a normal position.  Placing a device inside your shoe (orthotics) to help reduce pressure on your toe joint.  Taking medicine to ease pain, inflammation, and swelling.  Applying heat or ice to the affected area.  Doing stretching exercises.  Surgery to remove scar tissue and move the toes back into their normal position. This treatment is rare.  Follow these instructions at home:  Support your toe joint with proper footwear, shoe padding, or taping as told by your health care provider.  Take over-the-counter and prescription medicines only as told by your health care provider.  If directed, apply ice to the injured area: ? Put ice in a plastic bag. ? Place a towel between your skin and the bag. ? Leave the ice on for 20 minutes, 2-3 times per day.  If directed, apply heat to the affected area before you exercise. Use the heat source that your health care provider recommends, such as a moist heat pack or a heating pad. ? Place a towel between your   skin and the heat source. ? Leave the heat on for 20-30 minutes. ? Remove the heat if your skin turns bright red. This is especially important if you are unable to feel pain, heat, or cold. You may have a greater risk of getting burned.  Do exercises as told by your health care provider.  Keep all follow-up visits as told by your health care provider. Contact a health care provider if:  Your symptoms get worse.  Your symptoms do not improve in 2 weeks. Get help right away if:  You have severe pain and trouble with walking. This information is not intended to replace advice given  to you by your health care provider. Make sure you discuss any questions you have with your health care provider. Document Released: 07/31/2005 Document Revised: 01/06/2016 Document Reviewed: 02/28/2015 Elsevier Interactive Patient Education  2018 Elsevier Inc.  Pre-Operative Instructions  Congratulations, you have decided to take an important step towards improving your quality of life.  You can be assured that the doctors and staff at [facility] will be with you every step of the way.  Here are some important things you should know:  1. Plan to be at the surgery center/hospital at least 1 (one) hour prior to your scheduled time, unless otherwise directed by the surgical center/hospital staff.  You must have a responsible adult accompany you, remain during the surgery and drive you home.  Make sure you have directions to the surgical center/hospital to ensure you arrive on time. 2. If you are having surgery at [facility] or [facility], you will need a copy of your medical history and physical form from your family physician within one month prior to the date of surgery. We will give you a form for your primary physician to complete.  3. We make every effort to accommodate the date you request for surgery.  However, there are times where surgery dates or times have to be moved.  We will contact you as soon as possible if a change in schedule is required.   4. No aspirin/ibuprofen for one week before surgery.  If you are on aspirin, any non-steroidal anti-inflammatory medications (Mobic, Aleve, Ibuprofen) should not be taken seven (7) days prior to your surgery.  You make take Tylenol for pain prior to surgery.  5. Medications - If you are taking daily heart and blood pressure medications, seizure, reflux, allergy, asthma, anxiety, pain or diabetes medications, make sure you notify the surgery center/hospital before the day of surgery so they can tell you which medications you should  take or avoid the day of surgery. 6. No food or drink after midnight the night before surgery unless directed otherwise by surgical center/hospital staff. 7. No alcoholic beverages 24-hours prior to surgery.  No smoking 24-hours prior or 24-hours after surgery. 8. Wear loose pants or shorts. They should be loose enough to fit over bandages, boots, and casts. 9. Don't wear slip-on shoes. Sneakers are preferred. 10. Bring your boot with you to the surgery center/hospital.  Also bring crutches or a walker if your physician has prescribed it for you.  If you do not have this equipment, it will be provided for you after surgery. 11. If you have not been contacted by the surgery center/hospital by the day before your surgery, call to confirm the date and time of your surgery. 12. Leave-time from work may vary depending on the type of surgery you have.  Appropriate arrangements should be made prior   to surgery with your employer. 13. Prescriptions will be provided immediately following surgery by your doctor.  Fill these as soon as possible after surgery and take the medication as directed. Pain medications will not be refilled on weekends and must be approved by the doctor. 14. Remove nail polish on the operative foot and avoid getting pedicures prior to surgery. 15. Wash the night before surgery.  The night before surgery wash the foot and leg well with water and the antibacterial soap provided. Be sure to pay special attention to beneath the toenails and in between the toes.  Wash for at least three (3) minutes. Rinse thoroughly with water and dry well with a towel.  Perform this wash unless told not to do so by your physician.  Enclosed: 1 Ice pack (please put in freezer the night before surgery)   1 Hibiclens skin cleaner   Pre-op instructions  If you have any questions regarding the instructions, please do not hesitate to call our office.  [place]: [address], [place], [place] [address] --  [phone]  [place]: [address] [place], [place] [address] -- [phone]  [place]: [address]  [place], [place] [address] -- [phone]  [place]: [address], [address], [place], [place] [address] -- [phone]  Website: [email or web address]  

## 2017-11-27 ENCOUNTER — Encounter: Payer: Self-pay | Admitting: Podiatry

## 2017-11-27 ENCOUNTER — Telehealth: Payer: Self-pay | Admitting: *Deleted

## 2017-11-27 DIAGNOSIS — E039 Hypothyroidism, unspecified: Secondary | ICD-10-CM | POA: Diagnosis not present

## 2017-11-27 DIAGNOSIS — M2012 Hallux valgus (acquired), left foot: Secondary | ICD-10-CM | POA: Diagnosis not present

## 2017-11-27 DIAGNOSIS — M2011 Hallux valgus (acquired), right foot: Secondary | ICD-10-CM | POA: Diagnosis not present

## 2017-11-27 DIAGNOSIS — M722 Plantar fascial fibromatosis: Secondary | ICD-10-CM | POA: Diagnosis not present

## 2017-11-27 DIAGNOSIS — M21611 Bunion of right foot: Secondary | ICD-10-CM | POA: Diagnosis not present

## 2017-11-27 DIAGNOSIS — M21612 Bunion of left foot: Secondary | ICD-10-CM | POA: Diagnosis not present

## 2017-11-27 NOTE — Telephone Encounter (Signed)
[facility] called states percocet is spelled and there is something after his signature.

## 2017-11-27 NOTE — Telephone Encounter (Signed)
I spoke with Peyton NajjarLarry - [facility] and he said the percocet was spelled wrong and there is something at the end of the zofran that looks like nausea but ends after Dr. Beverlee Nimsegal's name. I told him pt had surgery and the rx was for percocet and the word was nausea after Dr. Beverlee Nimsegal's name.

## 2017-12-05 ENCOUNTER — Ambulatory Visit (INDEPENDENT_AMBULATORY_CARE_PROVIDER_SITE_OTHER): Payer: BLUE CROSS/BLUE SHIELD | Admitting: Podiatry

## 2017-12-05 ENCOUNTER — Encounter: Payer: Self-pay | Admitting: Podiatry

## 2017-12-05 ENCOUNTER — Ambulatory Visit (INDEPENDENT_AMBULATORY_CARE_PROVIDER_SITE_OTHER): Payer: BLUE CROSS/BLUE SHIELD

## 2017-12-05 VITALS — BP 137/78 | HR 68 | Temp 96.3°F

## 2017-12-05 DIAGNOSIS — M21619 Bunion of unspecified foot: Secondary | ICD-10-CM

## 2017-12-05 DIAGNOSIS — M722 Plantar fascial fibromatosis: Secondary | ICD-10-CM

## 2017-12-05 NOTE — Progress Notes (Signed)
Patient states feeling pretty goodSubjective:   Patient ID: Emily EarthlyMechelle A [name], female   DOB: 52 y.o.   MRN: [id number]   HPI Patient states feeling pretty good with minimal discomfort   ROS      Objective:  Physical Exam  Neurovascular status intact with patient's feet doing well with wound edges well coapted in good alignment     Assessment:  Doing well post McBride bunionectomy bilateral     Plan:  X-rays reviewed and advised on range of motion exercises gradually getting it wet and continued immobilization compression  X-rays indicated that satisfactory resection of bone has occurred

## 2017-12-26 ENCOUNTER — Ambulatory Visit (INDEPENDENT_AMBULATORY_CARE_PROVIDER_SITE_OTHER): Payer: BLUE CROSS/BLUE SHIELD | Admitting: Podiatry

## 2017-12-26 ENCOUNTER — Ambulatory Visit (INDEPENDENT_AMBULATORY_CARE_PROVIDER_SITE_OTHER): Payer: BLUE CROSS/BLUE SHIELD

## 2017-12-26 DIAGNOSIS — M722 Plantar fascial fibromatosis: Secondary | ICD-10-CM

## 2017-12-26 DIAGNOSIS — M21619 Bunion of unspecified foot: Secondary | ICD-10-CM | POA: Diagnosis not present

## 2017-12-26 DIAGNOSIS — Z9889 Other specified postprocedural states: Secondary | ICD-10-CM

## 2017-12-26 NOTE — Progress Notes (Signed)
Subjective:   Patient ID: [name], female   DOB: 52 y.o.   MRN: [id number]   HPI Patient presents stating my pain is continuing to improve and I am ready to get back into regular shoes   ROS      Objective:  Physical Exam  Neurovascular status intact with improved structural correction of the first MPJ bilateral with reduced edema and discomfort     Assessment:  Doing well post McBride bunionectomy bilateral     Plan:  H&P x-rays reviewed and today I went ahead and advised on gradual return to normal activities and explained ways to take care of the incision site.  Patient will be seen back for [place] to recheck  X-rays indicate that there is satisfactory resection of bone medial side first metatarsal head bilateral

## 2018-01-02 DIAGNOSIS — R7309 Other abnormal glucose: Secondary | ICD-10-CM | POA: Diagnosis not present

## 2018-01-02 DIAGNOSIS — Z6831 Body mass index (BMI) 31.0-31.9, adult: Secondary | ICD-10-CM | POA: Diagnosis not present

## 2018-01-02 DIAGNOSIS — E785 Hyperlipidemia, unspecified: Secondary | ICD-10-CM | POA: Diagnosis not present

## 2018-01-02 DIAGNOSIS — E6609 Other obesity due to excess calories: Secondary | ICD-10-CM | POA: Diagnosis not present

## 2018-01-02 DIAGNOSIS — I10 Essential (primary) hypertension: Secondary | ICD-10-CM | POA: Diagnosis not present

## 2018-01-02 DIAGNOSIS — M791 Myalgia, unspecified site: Secondary | ICD-10-CM | POA: Diagnosis not present

## 2018-01-02 DIAGNOSIS — Z1389 Encounter for screening for other disorder: Secondary | ICD-10-CM | POA: Diagnosis not present

## 2018-01-02 DIAGNOSIS — E039 Hypothyroidism, unspecified: Secondary | ICD-10-CM | POA: Diagnosis not present

## 2018-01-14 ENCOUNTER — Telehealth: Payer: Self-pay | Admitting: Cardiology

## 2018-01-14 NOTE — Telephone Encounter (Signed)
Pt calling    *STAT* If patient is at the pharmacy, call can be transferred to refill team.   1. Which medications need to be refilled? (please list name of each medication and dose if known) Amlodipine don't know mg  Olmersartin don't know mg 2. Which pharmacy/location (including street and city if local pharmacy) [facility]  3. Do they need a 30 day or 90 day supply? 90   Pt didn't have bottles in front of her,

## 2018-01-15 MED ORDER — OLMESARTAN MEDOXOMIL 40 MG PO TABS: 40.0000 mg | ORAL_TABLET | Freq: Every morning | ORAL | 1 refills | Status: DC

## 2018-01-15 MED ORDER — AMLODIPINE BESYLATE 10 MG PO TABS: 10.0000 mg | ORAL_TABLET | Freq: Every evening | ORAL | 1 refills | Status: DC

## 2018-01-30 ENCOUNTER — Other Ambulatory Visit: Payer: BLUE CROSS/BLUE SHIELD | Admitting: Podiatry

## 2018-01-31 ENCOUNTER — Ambulatory Visit: Payer: BLUE CROSS/BLUE SHIELD | Admitting: Podiatry

## 2018-02-07 ENCOUNTER — Ambulatory Visit (INDEPENDENT_AMBULATORY_CARE_PROVIDER_SITE_OTHER): Payer: BLUE CROSS/BLUE SHIELD | Admitting: Podiatry

## 2018-02-07 ENCOUNTER — Ambulatory Visit (INDEPENDENT_AMBULATORY_CARE_PROVIDER_SITE_OTHER): Payer: BLUE CROSS/BLUE SHIELD

## 2018-02-07 ENCOUNTER — Encounter: Payer: Self-pay | Admitting: Podiatry

## 2018-02-07 DIAGNOSIS — M2040 Other hammer toe(s) (acquired), unspecified foot: Secondary | ICD-10-CM

## 2018-02-07 DIAGNOSIS — M2041 Other hammer toe(s) (acquired), right foot: Secondary | ICD-10-CM | POA: Diagnosis not present

## 2018-02-07 DIAGNOSIS — M21619 Bunion of unspecified foot: Secondary | ICD-10-CM

## 2018-02-07 DIAGNOSIS — M2042 Other hammer toe(s) (acquired), left foot: Secondary | ICD-10-CM

## 2018-02-07 DIAGNOSIS — R600 Localized edema: Secondary | ICD-10-CM

## 2018-02-08 NOTE — Progress Notes (Signed)
Subjective:   Patient ID: [name], female   DOB: 52 y.o.   MRN: [id number]   HPI Patient presents stating overall doing pretty well but concerned about swelling and I still get right over left foot   ROS      Objective:  Physical Exam  Neurovascular status intact with patient's incision site is healing well with mild swelling at the current time but at times she does have more swelling especially at the end of the day     Assessment:  Overall doing well with moderate increase in swelling over what I would expect with possibility patient has been an issue but did have negative Homans sign bilateral     Plan:  X-rays reviewed and I have advised on aggressive lunchtime and ice therapy along with elevation at lunch and I do believe all the symptoms will go away over time but it may take longer to do  X-rays indicate satisfactory resection of bone medial side first metatarsal bilateral with excellent position second digit metatarsal bilateral screws in place

## 2018-02-23 DIAGNOSIS — I10 Essential (primary) hypertension: Secondary | ICD-10-CM | POA: Diagnosis not present

## 2018-02-23 DIAGNOSIS — M542 Cervicalgia: Secondary | ICD-10-CM | POA: Diagnosis not present

## 2018-04-03 ENCOUNTER — Other Ambulatory Visit: Payer: Self-pay | Admitting: Cardiology

## 2018-04-03 NOTE — Telephone Encounter (Signed)
Rx sent to pharmacy   

## 2018-06-06 NOTE — Progress Notes (Signed)
REVIEWED-NO ADDITIONAL RECOMMENDATIONS. 

## 2018-07-01 DIAGNOSIS — E039 Hypothyroidism, unspecified: Secondary | ICD-10-CM | POA: Diagnosis not present

## 2018-07-01 DIAGNOSIS — M791 Myalgia, unspecified site: Secondary | ICD-10-CM | POA: Diagnosis not present

## 2018-07-01 DIAGNOSIS — Z6832 Body mass index (BMI) 32.0-32.9, adult: Secondary | ICD-10-CM | POA: Diagnosis not present

## 2018-07-01 DIAGNOSIS — Z1389 Encounter for screening for other disorder: Secondary | ICD-10-CM | POA: Diagnosis not present

## 2018-07-01 DIAGNOSIS — M1991 Primary osteoarthritis, unspecified site: Secondary | ICD-10-CM | POA: Diagnosis not present

## 2018-07-01 DIAGNOSIS — E6609 Other obesity due to excess calories: Secondary | ICD-10-CM | POA: Diagnosis not present

## 2018-07-01 DIAGNOSIS — E782 Mixed hyperlipidemia: Secondary | ICD-10-CM | POA: Diagnosis not present

## 2018-07-01 DIAGNOSIS — M779 Enthesopathy, unspecified: Secondary | ICD-10-CM | POA: Diagnosis not present

## 2018-07-01 DIAGNOSIS — I10 Essential (primary) hypertension: Secondary | ICD-10-CM | POA: Diagnosis not present

## 2018-07-04 ENCOUNTER — Other Ambulatory Visit: Payer: Self-pay | Admitting: Cardiology

## 2018-07-07 ENCOUNTER — Other Ambulatory Visit: Payer: Self-pay | Admitting: Cardiology

## 2018-07-08 ENCOUNTER — Other Ambulatory Visit: Payer: Self-pay | Admitting: Cardiology

## 2018-08-08 DIAGNOSIS — Z01419 Encounter for gynecological examination (general) (routine) without abnormal findings: Secondary | ICD-10-CM | POA: Diagnosis not present

## 2018-08-08 DIAGNOSIS — Z6831 Body mass index (BMI) 31.0-31.9, adult: Secondary | ICD-10-CM | POA: Diagnosis not present

## 2018-08-08 DIAGNOSIS — Z1231 Encounter for screening mammogram for malignant neoplasm of breast: Secondary | ICD-10-CM | POA: Diagnosis not present

## 2018-08-21 ENCOUNTER — Other Ambulatory Visit: Payer: Self-pay | Admitting: Cardiology

## 2018-08-23 ENCOUNTER — Other Ambulatory Visit: Payer: Self-pay | Admitting: Cardiology

## 2018-09-02 ENCOUNTER — Other Ambulatory Visit: Payer: Self-pay | Admitting: Cardiology

## 2018-09-02 NOTE — Telephone Encounter (Signed)
Rx has been sent to the pharmacy electronically. ° °

## 2018-09-17 ENCOUNTER — Encounter: Payer: Self-pay | Admitting: Cardiology

## 2018-09-17 ENCOUNTER — Ambulatory Visit (INDEPENDENT_AMBULATORY_CARE_PROVIDER_SITE_OTHER): Payer: BLUE CROSS/BLUE SHIELD | Admitting: Cardiology

## 2018-09-17 VITALS — BP 118/88 | HR 72 | Ht 65.5 in | Wt 198.8 lb

## 2018-09-17 DIAGNOSIS — E785 Hyperlipidemia, unspecified: Secondary | ICD-10-CM

## 2018-09-17 DIAGNOSIS — R002 Palpitations: Secondary | ICD-10-CM

## 2018-09-17 DIAGNOSIS — I10 Essential (primary) hypertension: Secondary | ICD-10-CM

## 2018-09-17 DIAGNOSIS — M7989 Other specified soft tissue disorders: Secondary | ICD-10-CM | POA: Diagnosis not present

## 2018-09-17 MED ORDER — OLMESARTAN MEDOXOMIL 40 MG PO TABS: ORAL_TABLET | ORAL | 3 refills | Status: DC

## 2018-09-17 MED ORDER — POTASSIUM CHLORIDE CRYS ER 20 MEQ PO TBCR: 20.0000 meq | EXTENDED_RELEASE_TABLET | Freq: Every day | ORAL | 3 refills | Status: DC

## 2018-09-17 MED ORDER — AMLODIPINE BESYLATE 10 MG PO TABS: 10.0000 mg | ORAL_TABLET | Freq: Every day | ORAL | 3 refills | Status: DC

## 2018-09-17 MED ORDER — PRAVASTATIN SODIUM 20 MG PO TABS: 20.0000 mg | ORAL_TABLET | Freq: Every evening | ORAL | 3 refills | Status: DC

## 2018-09-17 NOTE — Patient Instructions (Signed)
Medication Instructions:  NOT NEEDED If you need a refill on your cardiac medications before your next appointment, please call your pharmacy.   Lab work: NOT NEEDED If you have labs (blood work) drawn today and your tests are completely normal, you will receive your results only by: . MyChart Message (if you have MyChart) OR . A paper copy in the mail If you have any lab test that is abnormal or we need to change your treatment, we will call you to review the results.  Testing/Procedures:  NOT NEEDED Follow-Up: At [facility], you and your health needs are our priority.  As part of our continuing mission to provide you with exceptional heart care, we have created designated Provider Care Teams.  These Care Teams include your primary Cardiologist (physician) and Advanced Practice Providers (APPs -  Physician Assistants and Nurse Practitioners) who all work together to provide you with the care you need, when you need it. . You will need a follow up appointment in  12   months.  Please call our office 2 months in advance to schedule this appointment.  You may see David Harding, MD or one of the following Advanced Practice Providers on your designated Care Team:   . Rhonda Barrett, PA-C . Kathryn Lawrence, DNP, ANP  Any Other Special Instructions Will Be Listed Below (If Applicable). 

## 2018-09-17 NOTE — Progress Notes (Signed)
PCP: Assunta Found, MD  Clinic Note: Chief Complaint  Patient presents with  . Follow-up    Doing well.  Needs med refill.  . Hypertension    Well-controlled    HPI: [name] is a 53 y.o. female with a PMH notable for hypertension and hyperlipidemia who presents today for annual follow-up. She also has .  Elliott A Erazo was last seen in October 2017  Recent Hospitalizations:   n/a  Studies Personally Reviewed - (if available, images/films reviewed: From Epic Chart or Care Everywhere)  No new studies  Interval History: [name] returns here today really with no complaints.  She is try to stay active exercising.  She does at least 45 minutes 1 hour on the treadmill most days.  She otherwise also walks in the neighborhood regularly.  She is actively trying to lose weight and hopefully improve her lipids.  Her PCP recently increased her pravastatin to 20 mg based on her most recent labs.  This is from November where her LDL was 136. We switched her PRN diuretic to spironolactone 25 mg.  She now takes that may be once or twice a week for swelling, and states that that usually keeps everything under control.  She denies any PND orthopnea.  No chest tightness pressure with rest or exertion.  She has not had any recurrence of palpitations on the current dose of Toprol.  No syncope/near syncope or TIA/amaurosis fugax. No vertigo or orthostatic dizziness.  Overall headaches and dizziness are much better since her blood pressures controlled.  ROS: A comprehensive was performed. Review of Systems  Constitutional: Negative for malaise/fatigue and weight loss.  HENT: Negative for congestion and nosebleeds.   Eyes:       As noted in HPI  Respiratory: Negative for cough and shortness of breath.   Cardiovascular: Positive for leg swelling (Controlled with as needed spironolactone).       Per HPI  Gastrointestinal: Negative for blood in stool and melena.  Genitourinary:  Negative for hematuria.  Musculoskeletal: Negative for joint pain.       Cramps, made worse with diuretic  Neurological: Negative for dizziness and headaches.  Endo/Heme/Allergies: Negative for environmental allergies.    I have reviewed and (if needed) personally updated the patient's problem list, medications, allergies, past medical and surgical history, social and family history.   Past Medical History:  Diagnosis Date  . Constipation - functional   . Ectopic pregnancy   . Essential hypertension   . Hyperlipidemia LDL goal <130   . Obesity   . Palpitations    Normal echocardiogram with no valvular lesions. Normal nuclear stress test in March 2011 and August 2014  . Stress incontinence   . Thyroid disease    isolated, one month only, not on synthroid since 2013    Past Surgical History:  Procedure Laterality Date  . CardioNet Monitor  July 2014    Sinus rhythm, sinus tachycardia.  Shortness of breath, dizziness, and chest pain noted with sinus tachycardia  . CHOLECYSTECTOMY    . COLONOSCOPY N/A 02/06/2014   SLF:1. Normal mucosa in the terrminal ileum 2. The LEFT colon is redundant 3. The colon mucosa was otherwise normal 4. Moderate sizxed internal hemorrhoids 5. No source for change in the bowel habits identified. constipation most likely due to functional constipation.   [facility] ECHOCARDIOGRAPHY  March 2011; July 2014   Normal EF, essentially normal; no valvular lesions.  No PFO or ASD  . LAPAROSCOPIC ENDOMETRIOSIS FULGURATION  1990  . Treadmill Myoview Nuclear Stress Test  March 2011, August 2014   a) 2011: 6 minutes, 7 METS; Diaphragmatic attenuation otherwise no evidence of ischemia or infarction; b) 2014: Walked at 9 min, 10.1 METs, no ischemia or infarction with normal EF 66%. No EKG changes.    Current Meds  Medication Sig  . amLODipine (NORVASC) 10 MG tablet Take 1 tablet (10 mg total) by mouth daily.  . diphenhydrAMINE (BENADRYL) 25 MG tablet Take 25 mg by mouth  every 6 (six) hours as needed.  [name] Kitchen LINZESS 290 MCG CAPS capsule Take 1 capsule by mouth as needed.  . metoprolol succinate (TOPROL-XL) 50 MG 24 hr tablet TAKE 1 TABLET BY MOUTH EVERY DAY  . olmesartan (BENICAR) 40 MG tablet TAKE 1 TABLET BY MOUTH EVERY DAY IN THE MORNING  . potassium chloride SA (KLOR-CON M20) 20 MEQ tablet Take 1 tablet (20 mEq total) by mouth daily.  [name] Kitchen spironolactone (ALDACTONE) 25 MG tablet Take 1 tablet (25 mg total) by mouth daily as needed. Overdue for appointment, please call.  . traMADol (ULTRAM) 50 MG tablet Take 3 (three) times daily as needed by mouth for moderate pain.  . [DISCONTINUED] amLODipine (NORVASC) 10 MG tablet Take 1 tablet (10 mg total) by mouth daily. SCHEDULE OV FOR FURTHER REFILLS.  . [DISCONTINUED] hydrochlorothiazide (MICROZIDE) 12.5 MG capsule Take 12.5 mg daily as needed by mouth.   . [DISCONTINUED] KLOR-CON M20 20 MEQ tablet TAKE 1 TABLET BY MOUTH EVERY DAY  . [DISCONTINUED] olmesartan (BENICAR) 40 MG tablet TAKE 1 TABLET BY MOUTH EVERY DAY IN THE MORNING  . [DISCONTINUED] pravastatin (PRAVACHOL) 10 MG tablet Take 10 mg by mouth daily.    Allergies  Allergen Reactions  . Doxycycline Itching and Rash    Social History   Tobacco Use  . Smoking status: Never Smoker  . Smokeless tobacco: Never Used  Substance Use Topics  . Alcohol use: No    Alcohol/week: 0.0 standard drinks  . Drug use: No   Social History   Social History Narrative   Remain since her last visit. Her husband and is [name].      Is now working on exercising regularly doing walks for 45 to 60 minutes a day 5 days a week.     family history includes Heart disease in her mother; Hypertension in her father and mother.  Wt Readings from Last 3 Encounters:  09/17/18 198 lb 12.8 oz (90.2 kg)  10/01/17 193 lb 12.8 oz (87.9 kg)  06/27/17 197 lb (89.4 kg)    PHYSICAL EXAM BP 118/88   Pulse 72   Ht 5' 5.5" (1.664 m)   Wt 198 lb 12.8 oz (90.2 kg)   BMI 32.58 kg/m    Physical Exam  Constitutional: She is oriented to person, place, and time. She appears well-developed and well-nourished. No distress (Just a little bit shy and upset).  HENT:  Head: Normocephalic and atraumatic.  Neck: Normal range of motion. Neck supple. No hepatojugular reflux and no JVD present. Carotid bruit is not present.  Cardiovascular: Normal rate, regular rhythm, intact distal pulses and normal pulses.  No extrasystoles are present. Exam reveals no gallop and no friction rub.  Murmur heard.  Medium-pitched harsh early systolic murmur is present with a grade of 1/6 at the upper right sternal border and lower left sternal border. Pulmonary/Chest: Effort normal and breath sounds normal. No respiratory distress. She has no wheezes. She has no rales.  Neurological: She is alert and oriented to  person, place, and time.  Psychiatric: She has a normal mood and affect. Her behavior is normal. Judgment and thought content normal.  Nursing note and vitals reviewed.   Adult ECG Report Not checked  Other studies Reviewed: Additional studies/ records that were reviewed today include:  Recent Labs:   [facility]  TC 204, TG 85.  HDL 51.  LDL 136.  Creatinine 0.99, potassium 4.2.  TSH 1.59.  (01/03/2018: A1c 5.9.  Hemoglobin 12.2)  ASSESSMENT / PLAN: Problem List Items Addressed This Visit    Essential hypertension - Primary (Chronic)    Blood pressure now looks great on current meds.  Stabilized now on Toprol and amlodipine with as needed spironolactone.      Relevant Medications   amLODipine (NORVASC) 10 MG tablet   pravastatin (PRAVACHOL) 20 MG tablet   olmesartan (BENICAR) 40 MG tablet   Hyperlipidemia with target LDL less than 100    On pravastatin.  Dose recently increased based on most recent labs.  Defer follow-up to PCP.  Low threshold to convert to rosuvastatin if not at goal. She is working on diet and exercise which I encouraged to continue.      Relevant  Medications   amLODipine (NORVASC) 10 MG tablet   pravastatin (PRAVACHOL) 20 MG tablet   olmesartan (BENICAR) 40 MG tablet   Palpitations (Chronic)    Pretty much nonexistent on current dose of Toprol.      Relevant Orders   EKG 12-Lead (Completed)   Swelling of both lower and upper extremities (Chronic)    She had cramps with HCTZ.  Better with as needed spironolactone.  Using once or twice a week.  She does wear support stockings with travel or for prolonged standing.         Current medicines are reviewed at length with the patient today. (+/- concerns) - needs refill of meds. The following changes have been made: n/a  Patient Instructions  Medication Instructions:  NOT NEEDED If you need a refill on your cardiac medications before your next appointment, please call your pharmacy.   Lab work: NOT NEEDED If you have labs (blood work) drawn today and your tests are completely normal, you will receive your results only by: [name] Kitchen. MyChart Message (if you have MyChart) OR . A paper copy in the mail If you have any lab test that is abnormal or we need to change your treatment, we will call you to review the results.  Testing/Procedures: NOT NEEDED  Follow-Up: [facility], you and your health needs are our priority.  As part of our continuing mission to provide you with exceptional heart care, we have created designated Provider Care Teams.  These Care Teams include your primary Cardiologist (physician) and Advanced Practice Providers (APPs -  Physician Assistants and Nurse Practitioners) who all work together to provide you with the care you need, when you need it. You will need a follow up appointment in 12 months.  Please call our office 2 months in advance to schedule this appointment.  You may see Bryan Lemmaavid Josephus Harriger, MD or one of the following Advanced Practice Providers on your designated Care Team:   Theodore DemarkRhonda Barrett, PA-C . Joni ReiningKathryn Lawrence, DNP, ANP  Any Other Special  Instructions Will Be Listed Below (If Applicable).    Studies Ordered:   Orders Placed This Encounter  Procedures  . EKG 12-Lead      Bryan Lemmaavid Caeleb Batalla, M.D., M.S. Interventional Cardiologist   Pager # [phone] Phone # [phone]  [address] [place], [place] [address]

## 2018-09-19 ENCOUNTER — Encounter: Payer: Self-pay | Admitting: Cardiology

## 2018-09-19 NOTE — Assessment & Plan Note (Signed)
On pravastatin.  Dose recently increased based on most recent labs.  Defer follow-up to PCP.  Low threshold to convert to rosuvastatin if not at goal. She is working on diet and exercise which I encouraged to continue.

## 2018-09-19 NOTE — Assessment & Plan Note (Signed)
She had cramps with HCTZ.  Better with as needed spironolactone.  Using once or twice a week.  She does wear support stockings with travel or for prolonged standing.

## 2018-09-19 NOTE — Assessment & Plan Note (Signed)
Blood pressure now looks great on current meds.  Stabilized now on Toprol and amlodipine with as needed spironolactone.

## 2018-09-19 NOTE — Assessment & Plan Note (Signed)
Pretty much nonexistent on current dose of Toprol.

## 2018-10-23 NOTE — Progress Notes (Signed)
REVIEWED. Nl HFP MAY 2016

## 2018-11-20 ENCOUNTER — Other Ambulatory Visit: Payer: Self-pay | Admitting: Cardiology

## 2018-11-20 NOTE — Telephone Encounter (Signed)
Benicar refilled.

## 2019-02-18 ENCOUNTER — Other Ambulatory Visit: Payer: Self-pay | Admitting: Cardiology

## 2019-02-18 NOTE — Telephone Encounter (Signed)
Rx(s) sent to pharmacy electronically.  

## 2019-03-26 ENCOUNTER — Other Ambulatory Visit: Payer: Self-pay

## 2019-03-26 DIAGNOSIS — Z20822 Contact with and (suspected) exposure to covid-19: Secondary | ICD-10-CM

## 2019-03-27 LAB — NOVEL CORONAVIRUS, NAA: SARS-CoV-2, NAA: NOT DETECTED

## 2019-06-04 DIAGNOSIS — E7849 Other hyperlipidemia: Secondary | ICD-10-CM | POA: Diagnosis not present

## 2019-06-04 DIAGNOSIS — S4992XD Unspecified injury of left shoulder and upper arm, subsequent encounter: Secondary | ICD-10-CM | POA: Diagnosis not present

## 2019-06-04 DIAGNOSIS — Z6833 Body mass index (BMI) 33.0-33.9, adult: Secondary | ICD-10-CM | POA: Diagnosis not present

## 2019-06-04 DIAGNOSIS — M791 Myalgia, unspecified site: Secondary | ICD-10-CM | POA: Diagnosis not present

## 2019-06-04 DIAGNOSIS — E6609 Other obesity due to excess calories: Secondary | ICD-10-CM | POA: Diagnosis not present

## 2019-06-04 DIAGNOSIS — E039 Hypothyroidism, unspecified: Secondary | ICD-10-CM | POA: Diagnosis not present

## 2019-06-04 DIAGNOSIS — R7309 Other abnormal glucose: Secondary | ICD-10-CM | POA: Diagnosis not present

## 2019-06-04 DIAGNOSIS — I10 Essential (primary) hypertension: Secondary | ICD-10-CM | POA: Diagnosis not present

## 2019-06-04 DIAGNOSIS — M25512 Pain in left shoulder: Secondary | ICD-10-CM | POA: Diagnosis not present

## 2019-06-17 DIAGNOSIS — M7582 Other shoulder lesions, left shoulder: Secondary | ICD-10-CM | POA: Diagnosis not present

## 2019-06-17 DIAGNOSIS — M5412 Radiculopathy, cervical region: Secondary | ICD-10-CM | POA: Diagnosis not present

## 2019-06-17 DIAGNOSIS — M542 Cervicalgia: Secondary | ICD-10-CM | POA: Diagnosis not present

## 2019-06-24 DIAGNOSIS — M25512 Pain in left shoulder: Secondary | ICD-10-CM | POA: Diagnosis not present

## 2019-06-24 DIAGNOSIS — M542 Cervicalgia: Secondary | ICD-10-CM | POA: Diagnosis not present

## 2019-06-30 DIAGNOSIS — M542 Cervicalgia: Secondary | ICD-10-CM | POA: Diagnosis not present

## 2019-06-30 DIAGNOSIS — M7582 Other shoulder lesions, left shoulder: Secondary | ICD-10-CM | POA: Diagnosis not present

## 2019-07-13 DIAGNOSIS — M7989 Other specified soft tissue disorders: Secondary | ICD-10-CM | POA: Diagnosis not present

## 2019-07-13 DIAGNOSIS — I10 Essential (primary) hypertension: Secondary | ICD-10-CM | POA: Diagnosis not present

## 2019-07-13 DIAGNOSIS — M62838 Other muscle spasm: Secondary | ICD-10-CM | POA: Diagnosis not present

## 2019-07-13 DIAGNOSIS — R600 Localized edema: Secondary | ICD-10-CM | POA: Diagnosis not present

## 2019-07-13 DIAGNOSIS — R252 Cramp and spasm: Secondary | ICD-10-CM | POA: Diagnosis not present

## 2019-08-17 ENCOUNTER — Other Ambulatory Visit: Payer: Self-pay | Admitting: Cardiology

## 2019-08-29 DIAGNOSIS — M2550 Pain in unspecified joint: Secondary | ICD-10-CM | POA: Diagnosis not present

## 2019-09-24 ENCOUNTER — Other Ambulatory Visit: Payer: Self-pay | Admitting: Cardiology

## 2019-09-26 DIAGNOSIS — R252 Cramp and spasm: Secondary | ICD-10-CM | POA: Diagnosis not present

## 2019-09-26 DIAGNOSIS — M791 Myalgia, unspecified site: Secondary | ICD-10-CM | POA: Diagnosis not present

## 2019-09-26 DIAGNOSIS — I10 Essential (primary) hypertension: Secondary | ICD-10-CM | POA: Diagnosis not present

## 2019-09-26 DIAGNOSIS — E7800 Pure hypercholesterolemia, unspecified: Secondary | ICD-10-CM | POA: Diagnosis not present

## 2019-10-16 DIAGNOSIS — Z20822 Contact with and (suspected) exposure to covid-19: Secondary | ICD-10-CM | POA: Diagnosis not present

## 2019-10-21 ENCOUNTER — Other Ambulatory Visit: Payer: Self-pay | Admitting: Cardiology

## 2019-10-21 DIAGNOSIS — G43909 Migraine, unspecified, not intractable, without status migrainosus: Secondary | ICD-10-CM | POA: Insufficient documentation

## 2019-10-21 DIAGNOSIS — R87619 Unspecified abnormal cytological findings in specimens from cervix uteri: Secondary | ICD-10-CM | POA: Insufficient documentation

## 2019-10-21 DIAGNOSIS — N809 Endometriosis, unspecified: Secondary | ICD-10-CM | POA: Insufficient documentation

## 2019-10-23 DIAGNOSIS — M62838 Other muscle spasm: Secondary | ICD-10-CM | POA: Diagnosis not present

## 2019-10-23 DIAGNOSIS — R739 Hyperglycemia, unspecified: Secondary | ICD-10-CM | POA: Diagnosis not present

## 2019-10-23 DIAGNOSIS — F5104 Psychophysiologic insomnia: Secondary | ICD-10-CM | POA: Diagnosis not present

## 2019-10-23 DIAGNOSIS — I10 Essential (primary) hypertension: Secondary | ICD-10-CM | POA: Diagnosis not present

## 2019-10-27 DIAGNOSIS — Z01419 Encounter for gynecological examination (general) (routine) without abnormal findings: Secondary | ICD-10-CM | POA: Diagnosis not present

## 2019-10-27 DIAGNOSIS — Z6833 Body mass index (BMI) 33.0-33.9, adult: Secondary | ICD-10-CM | POA: Diagnosis not present

## 2019-10-27 DIAGNOSIS — Z1231 Encounter for screening mammogram for malignant neoplasm of breast: Secondary | ICD-10-CM | POA: Diagnosis not present

## 2019-11-15 ENCOUNTER — Other Ambulatory Visit: Payer: Self-pay | Admitting: Cardiology

## 2019-11-19 ENCOUNTER — Other Ambulatory Visit: Payer: Self-pay | Admitting: Cardiology

## 2019-11-21 NOTE — Telephone Encounter (Signed)
Need annual appointment one month refill given

## 2019-11-27 DIAGNOSIS — M79605 Pain in left leg: Secondary | ICD-10-CM | POA: Diagnosis not present

## 2019-11-28 ENCOUNTER — Ambulatory Visit: Payer: BC Managed Care – PPO | Admitting: Neurology

## 2019-12-12 DIAGNOSIS — R252 Cramp and spasm: Secondary | ICD-10-CM | POA: Diagnosis not present

## 2019-12-12 DIAGNOSIS — M791 Myalgia, unspecified site: Secondary | ICD-10-CM | POA: Diagnosis not present

## 2019-12-12 DIAGNOSIS — M2550 Pain in unspecified joint: Secondary | ICD-10-CM | POA: Diagnosis not present

## 2019-12-19 DIAGNOSIS — M791 Myalgia, unspecified site: Secondary | ICD-10-CM | POA: Diagnosis not present

## 2019-12-19 DIAGNOSIS — R252 Cramp and spasm: Secondary | ICD-10-CM | POA: Diagnosis not present

## 2019-12-19 DIAGNOSIS — M2550 Pain in unspecified joint: Secondary | ICD-10-CM | POA: Diagnosis not present

## 2019-12-22 ENCOUNTER — Ambulatory Visit (INDEPENDENT_AMBULATORY_CARE_PROVIDER_SITE_OTHER): Payer: BC Managed Care – PPO

## 2019-12-22 ENCOUNTER — Ambulatory Visit (INDEPENDENT_AMBULATORY_CARE_PROVIDER_SITE_OTHER): Payer: BC Managed Care – PPO | Admitting: Podiatry

## 2019-12-22 ENCOUNTER — Other Ambulatory Visit: Payer: Self-pay | Admitting: Podiatry

## 2019-12-22 ENCOUNTER — Encounter: Payer: Self-pay | Admitting: Podiatry

## 2019-12-22 ENCOUNTER — Other Ambulatory Visit: Payer: Self-pay

## 2019-12-22 VITALS — Temp 97.2°F

## 2019-12-22 DIAGNOSIS — M2041 Other hammer toe(s) (acquired), right foot: Secondary | ICD-10-CM

## 2019-12-22 DIAGNOSIS — M79671 Pain in right foot: Secondary | ICD-10-CM | POA: Diagnosis not present

## 2019-12-22 DIAGNOSIS — M21619 Bunion of unspecified foot: Secondary | ICD-10-CM

## 2019-12-22 DIAGNOSIS — M2040 Other hammer toe(s) (acquired), unspecified foot: Secondary | ICD-10-CM

## 2019-12-22 NOTE — Patient Instructions (Signed)
Pre-Operative Instructions  Congratulations, you have decided to take an important step towards improving your quality of life.  You can be assured that the doctors and staff at [facility] will be with you every step of the way.  Here are some important things you should know:  1. Plan to be at the surgery center/hospital at least 1 (one) hour prior to your scheduled time, unless otherwise directed by the surgical center/hospital staff.  You must have a responsible adult accompany you, remain during the surgery and drive you home.  Make sure you have directions to the surgical center/hospital to ensure you arrive on time. 2. If you are having surgery at [facility] or [facility], you will need a copy of your medical history and physical form from your family physician within one month prior to the date of surgery. We will give you a form for your primary physician to complete.  3. We make every effort to accommodate the date you request for surgery.  However, there are times where surgery dates or times have to be moved.  We will contact you as soon as possible if a change in schedule is required.   4. No aspirin/ibuprofen for one week before surgery.  If you are on aspirin, any non-steroidal anti-inflammatory medications (Mobic, Aleve, Ibuprofen) should not be taken seven (7) days prior to your surgery.  You make take Tylenol for pain prior to surgery.  5. Medications - If you are taking daily heart and blood pressure medications, seizure, reflux, allergy, asthma, anxiety, pain or diabetes medications, make sure you notify the surgery center/hospital before the day of surgery so they can tell you which medications you should take or avoid the day of surgery. 6. No food or drink after midnight the night before surgery unless directed otherwise by surgical center/hospital staff. 7. No alcoholic beverages 24-hours prior to surgery.  No smoking 24-hours prior or 24-hours after  surgery. 8. Wear loose pants or shorts. They should be loose enough to fit over bandages, boots, and casts. 9. Don't wear slip-on shoes. Sneakers are preferred. 10. Bring your boot with you to the surgery center/hospital.  Also bring crutches or a walker if your physician has prescribed it for you.  If you do not have this equipment, it will be provided for you after surgery. 11. If you have not been contacted by the surgery center/hospital by the day before your surgery, call to confirm the date and time of your surgery. 12. Leave-time from work may vary depending on the type of surgery you have.  Appropriate arrangements should be made prior to surgery with your employer. 13. Prescriptions will be provided immediately following surgery by your doctor.  Fill these as soon as possible after surgery and take the medication as directed. Pain medications will not be refilled on weekends and must be approved by the doctor. 14. Remove nail polish on the operative foot and avoid getting pedicures prior to surgery. 15. Wash the night before surgery.  The night before surgery wash the foot and leg well with water and the antibacterial soap provided. Be sure to pay special attention to beneath the toenails and in between the toes.  Wash for at least three (3) minutes. Rinse thoroughly with water and dry well with a towel.  Perform this wash unless told not to do so by your physician.  Enclosed: 1 Ice pack (please put in freezer the night before surgery)   1 Hibiclens skin cleaner     Pre-op instructions  If you have any questions regarding the instructions, please do not hesitate to call our office.  [place]: [address], [place], [place] [address] -- [phone]  [place]: [address] [place], [place] [address] -- [phone]  [place]: [address], [place], [place] [address] -- [phone]   Website: [email or web address] 

## 2019-12-23 NOTE — Progress Notes (Signed)
Subjective:   Patient ID: [name], female   DOB: 54 y.o.   MRN: [id number]   HPI Patient presents stating that she is getting discomfort between her big toe and her second toe of her right foot and states that it is been bothersome for her and also her fourth toe is getting irritated as it comes under her third toe.  States she has pain on the bunion site and along the side of the big toe.  Other procedures were done very well for her and she is not been seen in almost 2 years   ROS      Objective:  Physical Exam  Neurovascular status intact with patient's right foot he healing well from previous surgery with mild structural bunion deformity and deviation of the hallux against the second toe creating irritation between the 2 toes.  I also noted the fourth toe right has distal deformity at the inner phalangeal joint and it is moderately painful as it comes under the third toe.  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  I did discuss a distal osteotomy of the first metatarsal right along with Akin osteotomy and distal arthroplasty digit 4 right and patient wants to undergo this treatment.  I explained procedure and risk and I allowed her to go over consent form reviewing at great length what would be required to fix this problem and she is willing to accept risk understanding that this is revisional surgery and that total recovery can take approximately 6 months.  Patient wants procedure signed consent form and today I dispensed air fracture walker with all instructions on usage and I wanted to get used to it prior to procedure and I encouraged her to call with questions concerns prior to her procedure which will most likely be performed in the next several weeks  X-rays indicate there is moderate prominence  around the first metatarsal head with reactive bone spur formation and slight increase in the intermetatarsal angle along with elevation of the hallux interphalangeus angle and deviation of the fourth digit right at the inner phalangeal joint

## 2020-01-06 ENCOUNTER — Encounter: Payer: Self-pay | Admitting: Cardiology

## 2020-01-06 ENCOUNTER — Other Ambulatory Visit: Payer: Self-pay

## 2020-01-06 ENCOUNTER — Ambulatory Visit (INDEPENDENT_AMBULATORY_CARE_PROVIDER_SITE_OTHER): Payer: BC Managed Care – PPO | Admitting: Cardiology

## 2020-01-06 VITALS — BP 120/88 | HR 80 | Temp 97.9°F | Ht 66.0 in | Wt 194.0 lb

## 2020-01-06 DIAGNOSIS — I10 Essential (primary) hypertension: Secondary | ICD-10-CM | POA: Diagnosis not present

## 2020-01-06 DIAGNOSIS — R002 Palpitations: Secondary | ICD-10-CM

## 2020-01-06 DIAGNOSIS — M7989 Other specified soft tissue disorders: Secondary | ICD-10-CM | POA: Diagnosis not present

## 2020-01-06 DIAGNOSIS — E785 Hyperlipidemia, unspecified: Secondary | ICD-10-CM | POA: Diagnosis not present

## 2020-01-06 MED ORDER — ROSUVASTATIN CALCIUM 20 MG PO TABS: 20.0000 mg | ORAL_TABLET | Freq: Every day | ORAL | 3 refills | Status: DC

## 2020-01-06 MED ORDER — OLMESARTAN MEDOXOMIL 40 MG PO TABS: ORAL_TABLET | ORAL | 3 refills | Status: DC

## 2020-01-06 MED ORDER — ROSUVASTATIN CALCIUM 20 MG PO TABS
20.0000 mg | ORAL_TABLET | Freq: Every day | ORAL | 3 refills | Status: DC
Start: 2020-01-06 — End: 2020-01-06

## 2020-01-06 MED ORDER — AMLODIPINE BESYLATE 10 MG PO TABS: ORAL_TABLET | ORAL | 3 refills | Status: DC

## 2020-01-06 MED ORDER — SPIRONOLACTONE 25 MG PO TABS: 25.0000 mg | ORAL_TABLET | Freq: Every day | ORAL | 3 refills | Status: DC | PRN

## 2020-01-06 MED ORDER — METOPROLOL SUCCINATE ER 50 MG PO TB24: 50.0000 mg | ORAL_TABLET | Freq: Every day | ORAL | 3 refills | Status: DC

## 2020-01-06 MED ORDER — POTASSIUM CHLORIDE CRYS ER 20 MEQ PO TBCR: 20.0000 meq | EXTENDED_RELEASE_TABLET | Freq: Every day | ORAL | 3 refills | Status: DC

## 2020-01-06 NOTE — Patient Instructions (Signed)
Medication Instructions:  STOP TAKING  PRAVASTATIN  START TAKING ROSUVASTATIN 20 MG AT BEDTIME  *If you need a refill on your cardiac medications before your next appointment, please call your pharmacy*   Lab Work: NOT NEEDED If you have labs (blood work) drawn today and your tests are completely normal, you will receive your results only by: [name] Kitchen MyChart Message (if you have MyChart) OR . A paper copy in the mail If you have any lab test that is abnormal or we need to change your treatment, we will call you to review the results.   Testing/Procedures: NOT NEEDED   Follow-Up: At [facility], you and your health needs are our priority.  As part of our continuing mission to provide you with exceptional heart care, we have created designated Provider Care Teams.  These Care Teams include your primary Cardiologist (physician) and Advanced Practice Providers (APPs -  Physician Assistants and Nurse Practitioners) who all work together to provide you with the care you need, when you need it.    Your next appointment:   12 month(s)  The format for your next appointment:   In Person  Provider:   Bryan Lemma, MD

## 2020-01-06 NOTE — Progress Notes (Signed)
Primary Care Provider: Sharilyn Sites, MD Cardiologist: Glenetta Hew, MD Electrophysiologist: None  Clinic Note: Chief Complaint  Patient presents with  . Follow-up    1 year.  . Edema    Feet, legs, and ankles.  . Headache    2-3 times a week.     HPI:    [name] is a 54 y.o. female with a PMH notable for hypertension and hyperlipidemia as well as palpitations below who presents today for delayed annual follow-up.  [name] was last seen on September 17, 2018.  No complaints.  Was doing at least 45 minutes to an hour on the treadmill most days and also lost weight.  Noted that PCP had just increased her pravastatin milligrams based on previous labs.-We previously switched her PRN diuretic to spironolactone which she was taking maybe twice a week.  Only noted rare palpitations on Toprol.  Recent Hospitalizations:   ER visit 29 2020 with muscle cramps.  Reviewed  CV studies:    The following studies were reviewed today: (if available, images/films reviewed: From Epic Chart or Care Everywhere) . None:   Interval History:   [name] returns here today overall doing pretty well.  He says her blood pressure has been pretty well controlled.  Her cramping also is actually a little better.  She did note that symptoms improved with the as needed spironolactone but still had a pretty bad spell back in November.  From a cardiac standpoint she denies any angina or heart failure symptoms.  Edema well controlled with minimal dosing of as needed meds.  CV Review of Symptoms (Summary) Cardiovascular ROS: no chest pain or dyspnea on exertion positive for - Cramps with occasional palpitations.  Well-controlled swelling negative for - irregular heartbeat, orthopnea, paroxysmal nocturnal dyspnea, shortness of breath or Syncope/near syncope, TIA/amaurosis fugax, claudication  The patient does not have symptoms concerning for COVID-19 infection (fever,  chills, cough, or new shortness of breath).  The patient is practicing social distancing & Masking.    REVIEWED OF SYSTEMS   Review of Systems  Constitutional: Negative for malaise/fatigue and weight loss.  HENT: Negative for nosebleeds.   Respiratory: Negative for shortness of breath.   Gastrointestinal: Negative for blood in stool and melena.  Genitourinary: Negative for hematuria.  Musculoskeletal: Positive for myalgias (Cramps).  Neurological: Negative for dizziness and headaches.  Psychiatric/Behavioral: Negative for memory loss. The patient is not nervous/anxious and does not have insomnia.    I have reviewed and (if needed) personally updated the patient's problem list, medications, allergies, past medical and surgical history, social and family history.   PAST MEDICAL HISTORY   Past Medical History:  Diagnosis Date  . Constipation - functional   . Ectopic pregnancy   . Essential hypertension   . Hyperlipidemia LDL goal <130   . Obesity   . Palpitations    Normal echocardiogram with no valvular lesions. Normal nuclear stress test in March 2011 and August 2014  . Stress incontinence   . Thyroid disease    isolated, one month only, not on synthroid since 2013    PAST SURGICAL HISTORY   Past Surgical History:  Procedure Laterality Date  . CardioNet Monitor  July 2014    Sinus rhythm, sinus tachycardia.  Shortness of breath, dizziness, and chest pain noted with sinus tachycardia  . CHOLECYSTECTOMY    . COLONOSCOPY N/A 02/06/2014   SLF:1. Normal mucosa in the terrminal ileum 2. The LEFT colon is redundant 3. The  colon mucosa was otherwise normal 4. Moderate sizxed internal hemorrhoids 5. No source for change in the bowel habits identified. constipation most likely due to functional constipation.   [facility] ECHOCARDIOGRAPHY  March 2011; July 2014   Normal EF, essentially normal; no valvular lesions.  No PFO or ASD  . LAPAROSCOPIC ENDOMETRIOSIS FULGURATION  1990  .  Treadmill Myoview Nuclear Stress Test  March 2011, August 2014   a) 2011: 6 minutes, 7 METS; Diaphragmatic attenuation otherwise no evidence of ischemia or infarction; b) 2014: Walked at 9 min, 10.1 METs, no ischemia or infarction with normal EF 66%. No EKG changes.    MEDICATIONS/ALLERGIES   Current Meds  Medication Sig  . amLODipine (NORVASC) 10 MG tablet TAKE 1 TABLET BY MOUTH DAILY.  . diphenhydrAMINE (BENADRYL) 25 MG tablet Take 25 mg by mouth every 6 (six) hours as needed.  [name] Kitchen levothyroxine (SYNTHROID) 50 MCG tablet Take 50 mcg by mouth daily.  [name] Kitchen LINZESS 290 MCG CAPS capsule Take 1 capsule by mouth as needed.  . methocarbamol (ROBAXIN) 750 MG tablet TAKE 1 TABLET EVERY 4 HRS AS NEEDED ORALLY 30 DAYS  . metoprolol succinate (TOPROL-XL) 50 MG 24 hr tablet Take 1 tablet (50 mg total) by mouth daily.  [name] Kitchen olmesartan (BENICAR) 40 MG tablet TAKE 1 TABLET BY MOUTH EVERY DAY IN THE MORNING  . potassium chloride SA (KLOR-CON M20) 20 MEQ tablet Take 1 tablet (20 mEq total) by mouth daily.  [name] Kitchen spironolactone (ALDACTONE) 25 MG tablet Take 1 tablet (25 mg total) by mouth daily as needed.  . [DISCONTINUED] amLODipine (NORVASC) 10 MG tablet TAKE 1 TABLET BY MOUTH DAILY. PLEASE SCHEDULE ANNUAL APPT WITH DR. Danyla Wattley FOR REFILLS.  . [DISCONTINUED] metoprolol succinate (TOPROL-XL) 50 MG 24 hr tablet TAKE 1 TABLET BY MOUTH EVERY DAY  . [DISCONTINUED] olmesartan (BENICAR) 40 MG tablet TAKE 1 TABLET BY MOUTH EVERY DAY IN THE MORNING  Needs office appt for further refills  . [DISCONTINUED] potassium chloride SA (KLOR-CON M20) 20 MEQ tablet Take 1 tablet (20 mEq total) by mouth daily. Please schedule annual appt with Dr. Herbie Baltimore for refills. [phone]. 1st attempt.  . [DISCONTINUED] pravastatin (PRAVACHOL) 20 MG tablet Take 1 tablet (20 mg total) by mouth every evening.  . [DISCONTINUED] spironolactone (ALDACTONE) 25 MG tablet Take 1 tablet (25 mg total) by mouth daily as needed. Overdue for appointment, please  call.    Allergies  Allergen Reactions  . Doxycycline Itching and Rash    SOCIAL HISTORY/FAMILY HISTORY   Reviewed in Epic:  Pertinent findings: No changes  OBJCTIVE -PE, EKG, labs   Wt Readings from Last 3 Encounters:  01/06/20 194 lb (88 kg)  09/17/18 198 lb 12.8 oz (90.2 kg)  10/01/17 193 lb 12.8 oz (87.9 kg)    Physical Exam: BP 120/88 (BP Location: Left Arm, Patient Position: Sitting, Cuff Size: Normal)   Pulse 80   Temp 97.9 F (36.6 C)   Ht 5\' 6"  (1.676 m)   Wt 194 lb (88 kg)   BMI 31.31 kg/m  Physical Exam  Constitutional: She is oriented to person, place, and time. She appears well-developed and well-nourished. No distress.  HENT:  Head: Normocephalic and atraumatic.  Neck: No JVD present. Carotid bruit is not present.  Cardiovascular: Normal rate, regular rhythm, normal heart sounds and intact distal pulses.  No extrasystoles are present. PMI is not displaced. Exam reveals no gallop and no friction rub.  No murmur heard. Pulmonary/Chest: Effort normal and breath sounds normal. No respiratory distress.  She has no wheezes. She has no rales.  Musculoskeletal:        General: No edema. Normal range of motion.     Cervical back: Normal range of motion and neck supple.  Neurological: She is alert and oriented to person, place, and time.  Psychiatric: She has a normal mood and affect. Her behavior is normal. Judgment and thought content normal.  Vitals reviewed.    Adult ECG Report  Rate: 80 ;  Rhythm: normal sinus rhythm and Normal axis, intervals and durations.;   Narrative Interpretation: Stable EKG  Recent Labs: Last labs from October 2020-TC 227, TG 119, HDL 47 LDL 159.  3/11 /2021-A1c 6.7.  11/27/2019 CR 1.01. No results found for: CHOL, HDL, LDLCALC, LDLDIRECT, TRIG, CHOLHDL Lab Results  Component Value Date   CREATININE 1.00 06/04/2017   BUN 8 06/04/2017   NA 140 06/04/2017   K 3.7 06/04/2017   CL 107 06/04/2017   CO2 26 06/04/2017      ASSESSMENT/PLAN    Problem List Items Addressed This Visit    Essential hypertension (Chronic)    Blood pressure now well controlled on current dose of amlodipine, Benicar, Toprol with as needed spironolactone.      Relevant Medications   metoprolol succinate (TOPROL-XL) 50 MG 24 hr tablet   olmesartan (BENICAR) 40 MG tablet   amLODipine (NORVASC) 10 MG tablet   rosuvastatin (CRESTOR) 20 MG tablet   spironolactone (ALDACTONE) 25 MG tablet   Palpitations (Chronic)    Well-controlled on Toprol.  No change.      Relevant Orders   EKG 12-Lead (Completed)   Lipid panel   Comprehensive metabolic panel   Hyperlipidemia with target LDL less than 100 - Primary (Chronic)    Not adequately controlled on pravastatin. Plan: Convert to rosuvastatin 20 mg nightly.  Monitor for worsening cramps.  Plan to recheck lipids in 3 months.      Relevant Medications   metoprolol succinate (TOPROL-XL) 50 MG 24 hr tablet   olmesartan (BENICAR) 40 MG tablet   amLODipine (NORVASC) 10 MG tablet   rosuvastatin (CRESTOR) 20 MG tablet   spironolactone (ALDACTONE) 25 MG tablet   Other Relevant Orders   Lipid panel   Comprehensive metabolic panel   Swelling of both lower and upper extremities (Chronic)    Did not tolerate HCTZ.  Using as needed spironolactone.  Also talked about using support stockings and elevation.          COVID-19 Education: The signs and symptoms of COVID-19 were discussed with the patient and how to seek care for testing (follow up with PCP or arrange E-visit).   The importance of social distancing and COVID-19 vaccination was discussed today.  I spent a total of with the patient. >  50% of the time was spent in direct patient consultation.  Additional time spent with chart review  / charting (studies, outside notes, etc): 6 Total Time: 24 min   Current medicines are reviewed at length with the patient today.  (+/- concerns) none  Notice: This dictation  was prepared with Dragon dictation along with smaller phrase technology. Any transcriptional errors that result from this process are unintentional and may not be corrected upon review.  Patient Instructions / Medication Changes & Studies & Tests Ordered   Patient Instructions  Medication Instructions:  STOP TAKING  PRAVASTATIN  START TAKING ROSUVASTATIN 20 MG AT BEDTIME  *If you need a refill on your cardiac medications before your next appointment, please call  your pharmacy*   Lab Work: NOT NEEDED If you have labs (blood work) drawn today and your tests are completely normal, you will receive your results only by: [name] Kitchen MyChart Message (if you have MyChart) OR . A paper copy in the mail If you have any lab test that is abnormal or we need to change your treatment, we will call you to review the results.   Testing/Procedures: NOT NEEDED   Follow-Up: At [facility], you and your health needs are our priority.  As part of our continuing mission to provide you with exceptional heart care, we have created designated Provider Care Teams.  These Care Teams include your primary Cardiologist (physician) and Advanced Practice Providers (APPs -  Physician Assistants and Nurse Practitioners) who all work together to provide you with the care you need, when you need it.    Your next appointment:   12 month(s)  The format for your next appointment:   In Person  Provider:   Bryan Lemma, MD      Studies Ordered:   Orders Placed This Encounter  Procedures  . Lipid panel  . Comprehensive metabolic panel  . EKG 12-Lead     Bryan Lemma, M.D., M.S. Interventional Cardiologist   Pager # [phone] Phone # [phone] [address] [place], [place] [address]   Thank you for choosing Heart[facility] at [facility]!!

## 2020-01-12 ENCOUNTER — Encounter: Payer: Self-pay | Admitting: Cardiology

## 2020-01-12 NOTE — Assessment & Plan Note (Signed)
Did not tolerate HCTZ.  Using as needed spironolactone.  Also talked about using support stockings and elevation.

## 2020-01-12 NOTE — Assessment & Plan Note (Signed)
Well-controlled on Toprol.  No change.

## 2020-01-12 NOTE — Assessment & Plan Note (Addendum)
Not adequately controlled on pravastatin. Plan: Convert to rosuvastatin 20 mg nightly.  Monitor for worsening cramps.  Plan to recheck lipids in 3 months.

## 2020-01-12 NOTE — Assessment & Plan Note (Signed)
Blood pressure now well controlled on current dose of amlodipine, Benicar, Toprol with as needed spironolactone.

## 2020-01-23 ENCOUNTER — Encounter: Payer: Self-pay | Admitting: Emergency Medicine

## 2020-01-27 ENCOUNTER — Telehealth: Payer: Self-pay

## 2020-01-27 NOTE — Telephone Encounter (Signed)
DOS 02/10/2020  [place] BUNIONECTOMY RT - 98338 [place] OSTEOTOMY RT - 28310 HAM[place]ERTOE REPAIR 4TH RT - 25053  [employer]BS EFFECTIVE DATE - 08/15/2019  PLAN DEDUCTIBLE - $1600.00 W/ $1013.00 REMAINING OUT OF POCKET - $5600.00 W/ $4701.00 REMAINING COPAY $0.00 COINSURANCE - 0%  NO AUTH REQUIRED

## 2020-02-03 NOTE — Telephone Encounter (Signed)
error 

## 2020-02-06 DIAGNOSIS — R252 Cramp and spasm: Secondary | ICD-10-CM | POA: Diagnosis not present

## 2020-02-06 DIAGNOSIS — M791 Myalgia, unspecified site: Secondary | ICD-10-CM | POA: Diagnosis not present

## 2020-02-09 MED ORDER — OXYCODONE-ACETAMINOPHEN 10-325 MG PO TABS: 1.0000 | ORAL_TABLET | ORAL | 0 refills | Status: DC | PRN

## 2020-02-09 MED ORDER — ONDANSETRON HCL 4 MG PO TABS: 4.0000 mg | ORAL_TABLET | Freq: Three times a day (TID) | ORAL | 0 refills | Status: DC | PRN

## 2020-02-09 NOTE — Addendum Note (Signed)
Addended by: Lenn Sink on: 02/09/2020 04:11 PM   Modules accepted: Orders

## 2020-02-10 ENCOUNTER — Encounter: Payer: Self-pay | Admitting: Podiatry

## 2020-02-10 DIAGNOSIS — I10 Essential (primary) hypertension: Secondary | ICD-10-CM | POA: Diagnosis not present

## 2020-02-10 DIAGNOSIS — M2011 Hallux valgus (acquired), right foot: Secondary | ICD-10-CM | POA: Diagnosis not present

## 2020-02-10 DIAGNOSIS — M2041 Other hammer toe(s) (acquired), right foot: Secondary | ICD-10-CM | POA: Diagnosis not present

## 2020-02-10 DIAGNOSIS — M205X1 Other deformities of toe(s) (acquired), right foot: Secondary | ICD-10-CM | POA: Diagnosis not present

## 2020-02-12 DIAGNOSIS — E559 Vitamin D deficiency, unspecified: Secondary | ICD-10-CM | POA: Diagnosis not present

## 2020-02-12 DIAGNOSIS — R252 Cramp and spasm: Secondary | ICD-10-CM | POA: Diagnosis not present

## 2020-02-14 ENCOUNTER — Other Ambulatory Visit: Payer: Self-pay | Admitting: Cardiology

## 2020-02-18 ENCOUNTER — Ambulatory Visit: Payer: BC Managed Care – PPO | Admitting: Podiatry

## 2020-02-18 ENCOUNTER — Ambulatory Visit (INDEPENDENT_AMBULATORY_CARE_PROVIDER_SITE_OTHER): Payer: BC Managed Care – PPO

## 2020-02-18 ENCOUNTER — Encounter: Payer: Self-pay | Admitting: Podiatry

## 2020-02-18 ENCOUNTER — Ambulatory Visit (INDEPENDENT_AMBULATORY_CARE_PROVIDER_SITE_OTHER): Payer: BC Managed Care – PPO | Admitting: Podiatry

## 2020-02-18 ENCOUNTER — Other Ambulatory Visit: Payer: Self-pay

## 2020-02-18 VITALS — BP 118/81 | HR 74 | Temp 97.7°F | Resp 16

## 2020-02-18 DIAGNOSIS — M2041 Other hammer toe(s) (acquired), right foot: Secondary | ICD-10-CM | POA: Diagnosis not present

## 2020-02-18 DIAGNOSIS — M21619 Bunion of unspecified foot: Secondary | ICD-10-CM

## 2020-02-18 MED ORDER — ONDANSETRON HCL 4 MG PO TABS: 4.0000 mg | ORAL_TABLET | Freq: Three times a day (TID) | ORAL | 0 refills | Status: DC | PRN

## 2020-02-18 MED ORDER — OXYCODONE-ACETAMINOPHEN 10-325 MG PO TABS: 1.0000 | ORAL_TABLET | ORAL | 0 refills | Status: DC | PRN

## 2020-02-18 NOTE — Progress Notes (Signed)
Subjective:   Patient ID: [name], female   DOB: 54 y.o.   MRN: [id number]   HPI Patient states she is doing very well but still having quite a bit of pain after surgery done last week.  States that she is bearing partial weight   ROS      Objective:  Physical Exam  Neurovascular status intact negative Homans' sign noted wound edges well coapted incision intact dressing intact     Assessment:  Doing well post forefoot reconstruction right     Plan:  Sterile dressing reapplied advised on continued elevation compression immobilization dispensed surgical shoe and Ace wrap and reviewed range of motion activity.  Reappoint 2 weeks suture removal or earlier if needed  X-rays indicate osteotomies healing well good alignment noted no signs of movement

## 2020-02-19 NOTE — Progress Notes (Signed)
Subjective:   Patient ID: [name], female   DOB: 54 y.o.   MRN: [id number]   HPI Patient was seen this morning states she is had some bloody want to get it checked   ROS      Objective:  Physical Exam  Neurovascular status unchanged with slight bleeding of the first metatarsal incision site      Assessment:  Probability that she irritated the incision site and has been bleeding since then when the dressing was changed this morning     Plan:  Dressing change today tolerated well

## 2020-03-03 ENCOUNTER — Encounter: Payer: Self-pay | Admitting: Podiatry

## 2020-03-03 ENCOUNTER — Ambulatory Visit (INDEPENDENT_AMBULATORY_CARE_PROVIDER_SITE_OTHER): Payer: BC Managed Care – PPO

## 2020-03-03 ENCOUNTER — Ambulatory Visit (INDEPENDENT_AMBULATORY_CARE_PROVIDER_SITE_OTHER): Payer: BC Managed Care – PPO | Admitting: Podiatry

## 2020-03-03 ENCOUNTER — Other Ambulatory Visit: Payer: Self-pay

## 2020-03-03 VITALS — Temp 97.8°F

## 2020-03-03 DIAGNOSIS — M2041 Other hammer toe(s) (acquired), right foot: Secondary | ICD-10-CM

## 2020-03-08 NOTE — Progress Notes (Signed)
Subjective:   Patient ID: [name], female   DOB: 54 y.o.   MRN: [id number]   HPI Patient states that overall she is doing pretty good but it still sore if she does too much and states that she is trying to increase her activities and likes the boot better than the shoe currently neuro   ROS      Objective:  Physical Exam  Vascular status intact with patient's right first metatarsal hallux in excellent alignment with mild reduction in motion of the first MPJ but no crepitus of the joint.  Hallux is in a rectus position and the second toe is in a normal position     Assessment:  Doing well overall post bunion surgery right but does have some reduced range of motion of the first MPJ     Plan:  H&P reviewed condition.  At this point we will get a try to increase her activity levels and I am get a send her to physical therapy for range of motion careful of the first MPJ staying away from the big toe along with edema reduction.  I did not see any drainage today I advised on continued compression and if any redness or if she were to note any drainage or anything she is to let me know immediately  X-rays indicate osteotomy is healing well fixation is in place hallux is in a rectus position

## 2020-04-01 ENCOUNTER — Other Ambulatory Visit: Payer: Self-pay

## 2020-04-01 ENCOUNTER — Encounter: Payer: Self-pay | Admitting: Podiatry

## 2020-04-01 ENCOUNTER — Ambulatory Visit (INDEPENDENT_AMBULATORY_CARE_PROVIDER_SITE_OTHER): Payer: BC Managed Care – PPO

## 2020-04-01 ENCOUNTER — Ambulatory Visit (INDEPENDENT_AMBULATORY_CARE_PROVIDER_SITE_OTHER): Payer: BC Managed Care – PPO | Admitting: Podiatry

## 2020-04-01 VITALS — Temp 97.2°F

## 2020-04-01 DIAGNOSIS — M2041 Other hammer toe(s) (acquired), right foot: Secondary | ICD-10-CM | POA: Diagnosis not present

## 2020-04-01 NOTE — Progress Notes (Signed)
Subjective:   Patient ID: [name], female   DOB: 54 y.o.   MRN: [id number]   HPI Patient states doing okay on the right foot and stating still has some discomfort if she is on her foot at times.   ROS      Objective:  Physical Exam  Neurovascular status intact negative Denna Haggard' sign was noted patient's right foot healing well wound edges well coapted hallux in rectus position range of motion adequate currently no crepitus of the joint with good alignment fourth digit     Assessment:  Doing well post surgical correction forefoot right     Plan:  H&P x-rays reviewed and advised on the continuation of range of motion exercises may return to work in 2 weeks and discussed continued anti-inflammatory usage and increased activity. Patient will be seen back 6 weeks or earlier if needed  X-rays indicate osteotomies healing well fixation in place no signs of movement

## 2020-05-17 ENCOUNTER — Encounter: Payer: BC Managed Care – PPO | Admitting: Podiatry

## 2020-05-24 ENCOUNTER — Ambulatory Visit (INDEPENDENT_AMBULATORY_CARE_PROVIDER_SITE_OTHER): Payer: BC Managed Care – PPO | Admitting: Podiatry

## 2020-05-24 ENCOUNTER — Encounter: Payer: Self-pay | Admitting: Podiatry

## 2020-05-24 ENCOUNTER — Ambulatory Visit (INDEPENDENT_AMBULATORY_CARE_PROVIDER_SITE_OTHER): Payer: BC Managed Care – PPO

## 2020-05-24 ENCOUNTER — Other Ambulatory Visit: Payer: Self-pay

## 2020-05-24 DIAGNOSIS — M21619 Bunion of unspecified foot: Secondary | ICD-10-CM

## 2020-05-24 NOTE — Progress Notes (Signed)
Subjective:   Patient ID: [name], female   DOB: 54 y.o.   MRN: [id number]   HPI Patient presents stating that her foot is feeling better and she is able to wear most of her shoes and overall she is very pleased with how everything is turned out with occasional swelling   ROS      Objective:  Physical Exam  Neurovascular status intact negative Denna Haggard' sign noted right first metatarsal healing well wound edges well coapted hallux in rectus position     Assessment:  Doing well post distal osteotomy Akin osteotomy right     Plan:  Reviewed condition and recommended the continuation of conservative care range of motion exercises and gradual return to all shoe gear.  Patient is discharged and is encouraged to call with questions concerns  Patient x-rays indicate good alignment fixation occurring slight movement around the first metatarsal that is healed by secondary intent fixation in place

## 2020-06-22 ENCOUNTER — Other Ambulatory Visit (HOSPITAL_COMMUNITY): Payer: Self-pay | Admitting: Family Medicine

## 2020-06-22 ENCOUNTER — Ambulatory Visit (HOSPITAL_COMMUNITY)
Admission: RE | Admit: 2020-06-22 | Discharge: 2020-06-22 | Disposition: A | Discharge status: Home / Self Care | Payer: BC Managed Care – PPO | Source: Ambulatory Visit | Attending: Family Medicine | Admitting: Family Medicine

## 2020-06-22 ENCOUNTER — Other Ambulatory Visit: Payer: Self-pay

## 2020-06-22 DIAGNOSIS — R1031 Right lower quadrant pain: Secondary | ICD-10-CM

## 2020-06-22 LAB — POCT I-STAT CREATININE: Creatinine, Ser: 1 mg/dL (ref 0.44–1.00)

## 2020-06-22 MED ORDER — IOHEXOL 9 MG/ML PO SOLN
ORAL | Status: AC
  Filled 2020-06-22: qty 1000

## 2020-06-22 MED ORDER — IOHEXOL 300 MG/ML  SOLN
100.0000 mL | Freq: Once | INTRAMUSCULAR | Status: AC | PRN
  Administered 2020-06-22: 100 mL via INTRAVENOUS

## 2020-06-23 LAB — COMPREHENSIVE METABOLIC PANEL
ALT: 22 IU/L (ref 0–32)
AST: 20 IU/L (ref 0–40)
Albumin/Globulin Ratio: 1.9 (ref 1.2–2.2)
Albumin: 4.5 g/dL (ref 3.8–4.9)
Alkaline Phosphatase: 76 IU/L (ref 44–121)
BUN/Creatinine Ratio: 9 (ref 9–23)
BUN: 9 mg/dL (ref 6–24)
Bilirubin Total: 0.5 mg/dL (ref 0.0–1.2)
CO2: 25 mmol/L (ref 20–29)
Calcium: 9.5 mg/dL (ref 8.7–10.2)
Chloride: 106 mmol/L (ref 96–106)
Creatinine, Ser: 0.98 mg/dL (ref 0.57–1.00)
GFR calc Af Amer: 75 mL/min/{1.73_m2} (ref >59)
GFR calc non Af Amer: 65 mL/min/{1.73_m2} (ref >59)
Globulin, Total: 2.4 g/dL (ref 1.5–4.5)
Glucose: 88 mg/dL (ref 65–99)
Potassium: 4.1 mmol/L (ref 3.5–5.2)
Sodium: 141 mmol/L (ref 134–144)
Total Protein: 6.9 g/dL (ref 6.0–8.5)

## 2020-06-23 LAB — LIPID PANEL
Chol/HDL Ratio: 2.7 ratio (ref 0.0–4.4)
Cholesterol, Total: 147 mg/dL (ref 100–199)
HDL: 55 mg/dL (ref >39)
LDL Chol Calc (NIH): 78 mg/dL (ref 0–99)
Triglycerides: 70 mg/dL (ref 0–149)
VLDL Cholesterol Cal: 14 mg/dL (ref 5–40)

## 2020-06-29 ENCOUNTER — Telehealth: Payer: Self-pay | Admitting: *Deleted

## 2020-06-29 NOTE — Telephone Encounter (Signed)
-----   Message from Marykay Lex, MD sent at 06/27/2020  1:32 PM EST ----- Cholesterol panel looks very good.  Dramatic improvement with switching to Crestor/Rosuvastatin.  LDL most about cholesterol is always 78.  This is close to the level that we would have for people with known coronary disease!!  This is great news.  Reduces your risk of developing heart more peripheral artery disease.!!  Chemistry panel shows normal kidney function and normal liver function.  Labs look great.  Great news!!  Bryan Lemma, MD

## 2020-06-29 NOTE — Telephone Encounter (Signed)
Left  detailed  message about lab results . Can review on mychart .  Any question may call back .

## 2020-10-18 ENCOUNTER — Encounter: Payer: Self-pay | Admitting: Internal Medicine

## 2020-11-18 ENCOUNTER — Ambulatory Visit (INDEPENDENT_AMBULATORY_CARE_PROVIDER_SITE_OTHER): Payer: BC Managed Care – PPO | Admitting: Internal Medicine

## 2020-11-18 ENCOUNTER — Other Ambulatory Visit: Payer: Self-pay

## 2020-11-18 ENCOUNTER — Encounter: Payer: Self-pay | Admitting: Internal Medicine

## 2020-11-18 VITALS — BP 132/78 | HR 81 | Temp 96.6°F | Ht 65.0 in | Wt 198.0 lb

## 2020-11-18 DIAGNOSIS — R1031 Right lower quadrant pain: Secondary | ICD-10-CM | POA: Diagnosis not present

## 2020-11-18 DIAGNOSIS — K5909 Other constipation: Secondary | ICD-10-CM

## 2020-11-18 MED ORDER — LUBIPROSTONE 24 MCG PO CAPS: 24.0000 ug | ORAL_CAPSULE | Freq: Two times a day (BID) | ORAL | 5 refills | Status: DC

## 2020-11-18 NOTE — Patient Instructions (Signed)
I will try you on Amitiza 24 mg twice daily in place of your Linzess.  If this is not covered by your insurance, please give [place] a call we will start a prior authorization.  Follow-up in 3 months or sooner if needed.  At [facility] we value your feedback. You may receive a survey about your visit today. Please share your experience as we strive to create trusting relationships with our patients to provide genuine, compassionate, quality care.  We appreciate your understanding and patience as we review any laboratory studies, imaging, and other diagnostic tests that are ordered as we care for you. Our office policy is 5 business days for review of these results, and any emergent or urgent results are addressed in a timely manner for your best interest. If you do not hear from our office in 1 week, please contact us.   We also encourage the use of MyChart, which contains your medical information for your review as well. If you are not enrolled in this feature, an access code is on this after visit summary for your convenience. Thank you for allowing [place] to be involved in your care.  It was great to see you today!  I hope you have a great rest of your spring!!    Hennie Duos. Marletta Lor, D.O. Gastroenterology and Hepatology [facility]

## 2020-11-22 ENCOUNTER — Telehealth: Payer: Self-pay | Admitting: Internal Medicine

## 2020-11-22 NOTE — Telephone Encounter (Signed)
[phone] patient called and said the dr carver send in a prescription for her and the pharmacy told her they needed something from our end before they could fill it

## 2020-11-24 ENCOUNTER — Other Ambulatory Visit: Payer: Self-pay

## 2020-11-24 ENCOUNTER — Other Ambulatory Visit: Payer: Self-pay | Admitting: Internal Medicine

## 2020-11-24 NOTE — Telephone Encounter (Signed)
Phoned to [facility], spoke with the pharmacist was advised the pt's Amitiza was not covered by her insurance and it needed a PA. I advised them to send it on through so Cover My Meds can get it to me so I can do a PA on it

## 2020-11-24 NOTE — Progress Notes (Signed)
Primary [name] Physician:  Emily Found, MD Primary Gastroenterologist:  Dr. Marletta [name]  Chief Complaint  Patient presents with  . Constipation    "all my life"  . Abdominal Pain    Right side abd and rlq. Daily    HPI:   [name] is a 55 y.o. female who presents to clinic today as a new patient.  Previously seen 6 years ago.  She has chronic constipation that she has been dealing with her entire life.  Currently takes Linzess 290 mcg daily which is not adequate.  She has to typically take over-the-counter laxatives on top of this to have a bowel movement.  She does note that is difficult for her to have bowel movements at work as she works at a bank cannot take long restroom breaks.  She typically has washout periods on the weekend when she takes extra laxatives.  Also notes right lower quadrant abdominal pain.  Last colonoscopy 2015 unremarkable besides internal hemorrhoids.  No family history of colorectal malignancy.  She had a CT abdomen pelvis on 06/22/2020 for left lower quadrant abdominal pain which was unremarkable.  Patient denies any melena or hematochezia.  No unintentional weight loss.  Past Medical History:  Diagnosis Date  . Constipation - functional   . Ectopic pregnancy   . Essential hypertension   . Hyperlipidemia LDL goal <130   . Obesity   . Palpitations    Normal echocardiogram with no valvular lesions. Normal nuclear stress test in March 2011 and August 2014  . Stress incontinence   . Thyroid disease    isolated, one month only, not on synthroid since 2013    Past Surgical History:  Procedure Laterality Date  . CardioNet Monitor  July 2014    Sinus rhythm, sinus tachycardia.  Shortness of breath, dizziness, and chest pain noted with sinus tachycardia  . CHOLECYSTECTOMY    . COLONOSCOPY N/A 02/06/2014   SLF:1. Normal mucosa in the terrminal ileum 2. The LEFT colon is redundant 3. The colon mucosa was otherwise normal 4. Moderate sizxed internal  hemorrhoids 5. No source for change in the bowel habits identified. constipation most likely due to functional constipation.   [name] ECHOCARDIOGRAPHY  March 2011; July 2014   Normal EF, essentially normal; no valvular lesions.  No PFO or ASD  . LAPAROSCOPIC ENDOMETRIOSIS FULGURATION  1990  . Treadmill Myoview Nuclear Stress Test  March 2011, August 2014   a) 2011: 6 minutes, 7 METS; Diaphragmatic attenuation otherwise no evidence of ischemia or infarction; b) 2014: Walked at 9 min, 10.1 METs, no ischemia or infarction with normal EF 66%. No EKG changes.    Current Outpatient Medications  Medication Sig Dispense Refill  . amLODipine (NORVASC) 10 MG tablet TAKE 1 TABLET BY MOUTH DAILY. 90 tablet 3  . Baclofen 5 MG TABS Take 1 tablet by mouth as needed.    . Cholecalciferol (VITAMIN D3) 1.25 MG (50000 UT) CAPS Take 1 capsule by mouth once a week.    . cyclobenzaprine (FLEXERIL) 10 MG tablet Take 10 mg by mouth at bedtime as needed.    . diphenhydrAMINE (BENADRYL) 25 MG tablet Take 25 mg by mouth every 6 (six) hours as needed.    [name] Kitchen levothyroxine (SYNTHROID) 50 MCG tablet Take 50 mcg by mouth daily.    [name] Kitchen LINZESS 290 MCG CAPS capsule Take 1 capsule by mouth as needed.    . lubiprostone (AMITIZA) 24 MCG capsule Take 1 capsule (24 mcg total) by mouth  2 (two) times daily with a meal. 60 capsule 5  . metoprolol succinate (TOPROL-XL) 50 MG 24 hr tablet TAKE 1 TABLET BY MOUTH EVERY DAY 90 tablet 3  . olmesartan (BENICAR) 40 MG tablet TAKE 1 TABLET BY MOUTH EVERY DAY IN THE MORNING 90 tablet 3  . potassium chloride SA (KLOR-CON M20) 20 MEQ tablet Take 1 tablet (20 mEq total) by mouth daily. 90 tablet 3  . rosuvastatin (CRESTOR) 20 MG tablet Take 1 tablet (20 mg total) by mouth daily. 90 tablet 3  . spironolactone (ALDACTONE) 25 MG tablet Take 1 tablet (25 mg total) by mouth daily as needed. 90 tablet 3  . zolpidem (AMBIEN) 10 MG tablet Take 10 mg by mouth at bedtime as needed.    . methocarbamol  (ROBAXIN) 750 MG tablet TAKE 1 TABLET EVERY 4 HRS AS NEEDED ORALLY 30 DAYS (Patient not taking: Reported on 11/18/2020)     No current facility-administered medications for this visit.    Allergies as of 11/18/2020 - Review Complete 11/18/2020  Allergen Reaction Noted  . Doxycycline Itching and Rash     Family History  Problem Relation Age of Onset  . Hypertension Mother   . Heart disease Mother   . Hypertension Father   . Colon cancer Neg Hx   . Liver disease Neg Hx     Social History   Socioeconomic History  . Marital status: Married    Spouse name: Not on file  . Number of children: 2  . Years of education: College  . Highest education level: Not on file  Occupational History  . Occupation: Nutritional therapist: [employer]    Comment: [name]  Tobacco Use  . Smoking status: Never Smoker  . Smokeless tobacco: Never Used  Substance and Sexual Activity  . Alcohol use: No    Alcohol/week: 0.0 standard drinks  . Drug use: No  . Sexual activity: Never  Other Topics Concern  . Not on file  Social History Narrative   Remain since her last visit. Her husband and is [name].      Is now working on exercising regularly doing walks for 45 to 60 minutes a day 5 days a week.   Social Determinants of Health   Financial Resource Strain: Not on file  Food Insecurity: Not on file  Transportation Needs: Not on file  Physical Activity: Not on file  Stress: Not on file  Social Connections: Not on file  Intimate Partner Violence: Not on file    Subjective: Review of Systems  Constitutional: Negative for chills and fever.  HENT: Negative for congestion and hearing loss.   Eyes: Negative for blurred vision and double vision.  Respiratory: Negative for cough and shortness of breath.   Cardiovascular: Negative for chest pain and palpitations.  Gastrointestinal: Positive for abdominal pain and constipation. Negative for blood in stool, diarrhea, heartburn, melena and  vomiting.  Genitourinary: Negative for dysuria and urgency.  Musculoskeletal: Negative for joint pain and myalgias.  Skin: Negative for itching and rash.  Neurological: Negative for dizziness and headaches.  Psychiatric/Behavioral: Negative for depression. The patient is not nervous/anxious.        Objective: BP 132/78   Pulse 81   Temp (!) 96.6 F (35.9 C) (Temporal)   Ht 5\' 5"  (1.651 m)   Wt 198 lb (89.8 kg)   LMP 05/08/2017   BMI 32.95 kg/m  Physical Exam Constitutional:      Appearance: Normal appearance.  HENT:  Head: Normocephalic and atraumatic.  Eyes:     Extraocular Movements: Extraocular movements intact.     Conjunctiva/sclera: Conjunctivae normal.  Cardiovascular:     Rate and Rhythm: Normal rate and regular rhythm.  Pulmonary:     Effort: Pulmonary effort is normal.     Breath sounds: Normal breath sounds.  Abdominal:     General: Bowel sounds are normal.     Palpations: Abdomen is soft.  Musculoskeletal:        General: No swelling. Normal range of motion.     Cervical back: Normal range of motion and neck supple.  Skin:    General: Skin is warm and dry.     Coloration: Skin is not jaundiced.  Neurological:     General: No focal deficit present.     Mental Status: She is alert and oriented to person, place, and time.  Psychiatric:        Mood and Affect: Mood normal.        Behavior: Behavior normal.      Assessment: *Chronic constipation *Right lower quadrant abdominal pain  Plan: Patient likely with irritable bowel syndrome, constipation predominant.  She has been dealing with this for many years.  She has been on Linzess for a long time which does not appear to be adequate as she does need to take over-the-counter laxatives on top of this.  Discussed constipation in depth with her today.  We will trial her on Amitiza 24 mg twice daily and monitor for improvement.  Patient to hold NSAIDs during this time.  If not covered by insurance, we  will proceed with prior authorization.  Patient to call office if this is the case.  Otherwise follow-up in 3 months or sooner if needed.  11/24/2020 9:24 AM   Disclaimer: This note was dictated with voice recognition software. Similar sounding words can inadvertently be transcribed and may not be corrected upon review.

## 2020-11-24 NOTE — Telephone Encounter (Signed)
Phoned and spoke with the pt and was advised the medication Dr. Marletta Lor sent in they  needed more information. I advised the pt it may be due to it needing a PA (not sure) but I would try and find out and give her a call. ([facility]

## 2020-11-25 NOTE — Telephone Encounter (Signed)
Dr. Marletta Lor  I have already sent this to you earlier but here is where [facility] sent to me

## 2020-11-26 NOTE — Telephone Encounter (Signed)
PA is needed for this medication which I started on

## 2020-11-30 ENCOUNTER — Ambulatory Visit (HOSPITAL_COMMUNITY): Admission: EM | Admit: 2020-11-30 | Discharge status: Against Medical Advice | Payer: Self-pay

## 2020-11-30 ENCOUNTER — Ambulatory Visit (HOSPITAL_COMMUNITY)
Admission: EM | Admit: 2020-11-30 | Discharge: 2020-11-30 | Disposition: A | Discharge status: Home / Self Care | Payer: BC Managed Care – PPO | Attending: Physician Assistant | Admitting: Physician Assistant

## 2020-11-30 ENCOUNTER — Other Ambulatory Visit: Payer: Self-pay

## 2020-11-30 ENCOUNTER — Encounter (HOSPITAL_COMMUNITY): Payer: Self-pay | Admitting: Physician Assistant

## 2020-11-30 DIAGNOSIS — M5442 Lumbago with sciatica, left side: Secondary | ICD-10-CM

## 2020-11-30 MED ORDER — PREDNISONE 5 MG (21) PO TBPK: ORAL_TABLET | ORAL | 0 refills | Status: DC

## 2020-11-30 MED ORDER — TIZANIDINE HCL 4 MG PO CAPS: 4.0000 mg | ORAL_CAPSULE | Freq: Two times a day (BID) | ORAL | 0 refills | Status: DC | PRN

## 2020-11-30 NOTE — Discharge Instructions (Signed)
Take Zanaflex up to twice daily for muscle spasms.  You should not take other muscle relaxers including methocarbamol (Robaxin), cyclobenzaprine (Flexeril), baclofen with this medication.  You should not drive or drink alcohol with taking this as drowsiness is a common side effect.  We have prescribed a prednisone taper and you should not take any NSAIDs with this due to risk of GI bleeding.  This includes aspirin, ibuprofen/Advil, naproxen/Aleve.  Please use heat and rest for additional symptom relief.  Follow-up with your PCP if symptoms persist as you may need physical therapy and/or imaging.  If anything worsens please return for further evaluation.

## 2020-11-30 NOTE — ED Provider Notes (Addendum)
[facility]    CSN: [id number] Arrival date & time: 11/30/20  1549      History   Chief Complaint Chief Complaint  Patient presents with  . Back Pain  . Leg Pain    HPI [name] is a 55 y.o. female.   Patient presents today with several day history of persistent lower back pain.  She denies any known injury or increase in activity prior to symptom onset.  Denies episodes of similar symptoms in the past, previous surgery, previous injury.  Pain is rated 10 on a 0-10 pain scale, localized to bilateral lumbar back with radiation into left leg, described as aching with periodic shooting pains, worse with certain movements, no alleviating factors identified.  She has tried ibuprofen and Flexeril without improvement of symptoms.  She denies any history of malignancy.  Denies associated fever, headache, nausea, vomiting, bowel/bladder incontinence, lower extremity weakness, saddle anesthesia.  Reports symptoms are interfering with her ability to perform daily activities and she had to miss work as a result of symptoms. Denies history of diabetes.      Past Medical History:  Diagnosis Date  . Constipation - functional   . Ectopic pregnancy   . Essential hypertension   . Hyperlipidemia LDL goal <130   . Obesity   . Palpitations    Normal echocardiogram with no valvular lesions. Normal nuclear stress test in March 2011 and August 2014  . Stress incontinence   . Thyroid disease    isolated, one month only, not on synthroid since 2013    Patient Active Problem List   Diagnosis Date Noted  . Endometriosis 10/21/2019  . Migraine 10/21/2019  . Abnormal cervical Papanicolaou smear 10/21/2019  . Accelerated hypertension 06/27/2017  . Swelling of both lower and upper extremities 05/13/2015  . Transaminitis 10/08/2014  . Obesity (BMI 30-39.9) 04/01/2014  . Hyperlipidemia with target LDL less than 100 12/16/2013  . Palpitations 02/01/2013  . Murmur 02/01/2013  .  Essential hypertension 03/28/2010  . CONSTIPATION, CHRONIC 03/28/2010  . Abdominal pain 03/28/2010    Past Surgical History:  Procedure Laterality Date  . CardioNet Monitor  July 2014    Sinus rhythm, sinus tachycardia.  Shortness of breath, dizziness, and chest pain noted with sinus tachycardia  . CHOLECYSTECTOMY    . COLONOSCOPY N/A 02/06/2014   SLF:1. Normal mucosa in the terrminal ileum 2. The LEFT colon is redundant 3. The colon mucosa was otherwise normal 4. Moderate sizxed internal hemorrhoids 5. No source for change in the bowel habits identified. constipation most likely due to functional constipation.   [facility] ECHOCARDIOGRAPHY  March 2011; July 2014   Normal EF, essentially normal; no valvular lesions.  No PFO or ASD  . LAPAROSCOPIC ENDOMETRIOSIS FULGURATION  1990  . Treadmill Myoview Nuclear Stress Test  March 2011, August 2014   a) 2011: 6 minutes, 7 METS; Diaphragmatic attenuation otherwise no evidence of ischemia or infarction; b) 2014: Walked at 9 min, 10.1 METs, no ischemia or infarction with normal EF 66%. No EKG changes.    OB History   No obstetric history on file.      Home Medications    Prior to Admission medications   Medication Sig Start Date End Date Taking? Authorizing Provider  predniSONE (STERAPRED UNI-PAK 21 TAB) 5 MG (21) TBPK tablet As directed. 11/30/20  Yes Cayman Kielbasa K, PA-C  tiZANidine (ZANAFLEX) 4 MG capsule Take 1 capsule (4 mg total) by mouth 2 (two) times daily as needed for  muscle spasms. 11/30/20  Yes Seraiah Nowack K, PA-C  amLODipine (NORVASC) 10 MG tablet TAKE 1 TABLET BY MOUTH DAILY. 01/06/20   Marykay Lex, MD  Cholecalciferol (VITAMIN D3) 1.25 MG (50000 UT) CAPS Take 1 capsule by mouth once a week. 02/12/20   [provider]  diphenhydrAMINE (BENADRYL) 25 MG tablet Take 25 mg by mouth every 6 (six) hours as needed.    [provider]  levothyroxine (SYNTHROID) 50 MCG tablet Take 50 mcg by mouth daily. 12/10/19    [provider]  LINZESS 290 MCG CAPS capsule Take 1 capsule by mouth as needed. 08/05/18   [provider]  lubiprostone (AMITIZA) 24 MCG capsule Take 1 capsule (24 mcg total) by mouth 2 (two) times daily with a meal. 11/18/20 05/17/21  Lanelle Bal, DO  metoprolol succinate (TOPROL-XL) 50 MG 24 hr tablet TAKE 1 TABLET BY MOUTH EVERY DAY 02/16/20   Marykay Lex, MD  olmesartan (BENICAR) 40 MG tablet TAKE 1 TABLET BY MOUTH EVERY DAY IN THE MORNING 01/06/20   Marykay Lex, MD  potassium chloride SA (KLOR-CON M20) 20 MEQ tablet Take 1 tablet (20 mEq total) by mouth daily. 01/06/20   Marykay Lex, MD  rosuvastatin (CRESTOR) 20 MG tablet Take 1 tablet (20 mg total) by mouth daily. 01/06/20 04/05/20  Marykay Lex, MD  spironolactone (ALDACTONE) 25 MG tablet Take 1 tablet (25 mg total) by mouth daily as needed. 01/06/20 04/05/20  Marykay Lex, MD  zolpidem (AMBIEN) 10 MG tablet Take 10 mg by mouth at bedtime as needed. 04/16/20   [provider]    Family History Family History  Problem Relation Age of Onset  . Hypertension Mother   . Heart disease Mother   . Hypertension Father   . Colon cancer Neg Hx   . Liver disease Neg Hx     Social History Social History   Tobacco Use  . Smoking status: Never Smoker  . Smokeless tobacco: Never Used  Substance Use Topics  . Alcohol use: No    Alcohol/week: 0.0 standard drinks  . Drug use: No     Allergies   Doxycycline   Review of Systems Review of Systems  Constitutional: Positive for activity change. Negative for appetite change, fatigue and fever.  Respiratory: Negative for cough and shortness of breath.   Cardiovascular: Negative for chest pain.  Gastrointestinal: Negative for abdominal pain, diarrhea, nausea and vomiting.  Musculoskeletal: Positive for back pain. Negative for arthralgias and myalgias.  Neurological: Negative for dizziness, weakness, light-headedness, numbness and headaches.      Physical Exam Triage Vital Signs ED Triage Vitals  Enc Vitals Group     BP 11/30/20 1713 (!) 144/80     Pulse Rate 11/30/20 1713 70     Resp 11/30/20 1713 16     Temp 11/30/20 1713 98.9 F (37.2 C)     Temp Source 11/30/20 1713 Oral     SpO2 11/30/20 1713 100 %     Weight --      Height --      Head Circumference --      Peak Flow --      Pain Score 11/30/20 1714 10     Pain Loc --      Pain Edu? --      Excl. in GC? --    No data found.  Updated Vital Signs BP (!) 144/80 (BP Location: Right Arm)   Pulse 70   Temp  98.9 F (37.2 C) (Oral)   Resp 16   LMP 05/08/2017   SpO2 100%   Visual Acuity Right Eye Distance:   Left Eye Distance:   Bilateral Distance:    Right Eye Near:   Left Eye Near:    Bilateral Near:     Physical Exam Vitals reviewed.  Constitutional:      General: She is awake. She is not in acute distress.    Appearance: Normal appearance. She is not ill-appearing.     Comments: Very pleasant female appears stated age in no acute distress  HENT:     Head: Normocephalic and atraumatic.  Cardiovascular:     Rate and Rhythm: Normal rate and regular rhythm.     Heart sounds: No murmur heard.   Pulmonary:     Effort: Pulmonary effort is normal.     Breath sounds: Normal breath sounds. No wheezing, rhonchi or rales.     Comments: Clear to auscultation bilaterally Musculoskeletal:     Cervical back: No tenderness or bony tenderness.     Thoracic back: No tenderness or bony tenderness.     Lumbar back: Tenderness present. No spasms or bony tenderness. Decreased range of motion. Negative right straight leg raise test and negative left straight leg raise test.     Comments: Significant palpation of bilateral lumbar paraspinal muscles.  Decreased range of motion with forward flexion, extension, rotation.  No pain on percussion of vertebrae.  Negative straight leg raise bilaterally.  FABER negative bilaterally.  Psychiatric:        Behavior:  Behavior is cooperative.      UC Treatments / Results  Labs (all labs ordered are listed, but only abnormal results are displayed) Labs Reviewed - No data to display  EKG   Radiology No results found.  Procedures Procedures (including critical care time)  Medications Ordered in UC Medications - No data to display  Initial Impression / Assessment and Plan / UC Course  I have reviewed the triage vital signs and the nursing notes.  Pertinent labs & imaging results that were available during my care of the patient were reviewed by me and considered in my medical decision making (see chart for details).     No indication for x-ray given no bony tenderness on exam and atraumatic injury.  Patient was prescribed Zanaflex with instruction not to take additional muscle relaxers with this medication.  Discussed that she should not drive or drink alcohol with this medication as drowsiness is a common side effect.  She was prescribed prednisone taper as NSAIDs have been ineffective.  She was instructed not to take NSAIDs with prednisone due to risk of GI bleeding.  Encouraged her to use heat, rest, stretch for additional symptom relief.  Discussed potential need for physical therapy referral and for imaging should symptoms persist but discussed this would need to be arranged by her PCP.  Strongly encouraged her to follow-up with PCP for further evaluation and management.  Strict return precautions given to which patient expressed understanding.  Final Clinical Impressions(s) / UC Diagnoses   Final diagnoses:  Acute bilateral low back pain with left-sided sciatica     Discharge Instructions     Take Zanaflex up to twice daily for muscle spasms.  You should not take other muscle relaxers including methocarbamol (Robaxin), cyclobenzaprine (Flexeril), baclofen with this medication.  You should not drive or drink alcohol with taking this as drowsiness is a common side effect.  We have  prescribed a  prednisone taper and you should not take any NSAIDs with this due to risk of GI bleeding.  This includes aspirin, ibuprofen/Advil, naproxen/Aleve.  Please use heat and rest for additional symptom relief.  Follow-up with your PCP if symptoms persist as you may need physical therapy and/or imaging.  If anything worsens please return for further evaluation.    ED Prescriptions    Medication Sig Dispense Auth. Provider   tiZANidine (ZANAFLEX) 4 MG capsule Take 1 capsule (4 mg total) by mouth 2 (two) times daily as needed for muscle spasms. 20 capsule Ari Engelbrecht K, PA-C   predniSONE (STERAPRED UNI-PAK 21 TAB) 5 MG (21) TBPK tablet As directed. 21 tablet Lamira Borin, Noberto RetortErin K, PA-C     PDMP not reviewed this encounter.   Jeani HawkingRaspet, Miaya Lafontant K, PA-C 11/30/20 1736    Antonieta Slaven, Noberto RetortErin K, PA-C 11/30/20 1737

## 2020-11-30 NOTE — ED Triage Notes (Signed)
Pt present back and leg pain in both legs. Pt states that symptoms started on Sunday. Pt state that the pain hurts when she bend or sit down.

## 2021-01-05 NOTE — Telephone Encounter (Signed)
Dr. Marletta Lor Alternate medication is requested. This is not covered by pt's insurance

## 2021-01-06 MED ORDER — PLECANATIDE 3 MG PO TABS: 3.0000 mg | ORAL_TABLET | Freq: Every day | ORAL | 5 refills | Status: AC

## 2021-01-06 NOTE — Telephone Encounter (Signed)
Phoned and advised the pt regarding Rx and instructions

## 2021-01-06 NOTE — Telephone Encounter (Signed)
We will switch patient to [facility] as it appears this is covered.

## 2021-01-06 NOTE — Telephone Encounter (Signed)
She needs to stop her Linzess while taking this medication.

## 2021-01-06 NOTE — Addendum Note (Signed)
Addended by: Lanelle Bal on: 01/06/2021 11:31 AM   Modules accepted: Orders

## 2021-01-23 ENCOUNTER — Other Ambulatory Visit: Payer: Self-pay | Admitting: Cardiology

## 2021-02-02 ENCOUNTER — Other Ambulatory Visit: Payer: Self-pay | Admitting: Cardiology

## 2021-02-08 ENCOUNTER — Encounter: Payer: Self-pay | Admitting: Internal Medicine

## 2021-02-09 ENCOUNTER — Other Ambulatory Visit: Payer: Self-pay

## 2021-02-11 ENCOUNTER — Other Ambulatory Visit: Payer: Self-pay

## 2021-02-11 ENCOUNTER — Telehealth: Payer: Self-pay | Admitting: Cardiology

## 2021-02-11 NOTE — Telephone Encounter (Signed)
Pt c/o medication issue:  1. Name of Medication:  amLODipine (NORVASC) 10 MG tablet olmesartan (BENICAR) 40 MG tablet  2. How are you currently taking this medication (dosage and times per day)?   3. Are you having a reaction (difficulty breathing--STAT)?   4. What is your medication issue?   Sandy with [facility] is requesting clarification on the medication instructions for these 2 medications. She states they sent a fax requesting instructions on 02/04/21 and this is their final notice.  Looks like the patient is overdue for a follow up with Dr. Herbie Baltimore. I advised Andrey Campanile to please make the patient aware of that if she speaks with her. Andrey Campanile understood and would still like a call to discuss whether or not they need to distribute the medication to the patient. Please return call to discuss.  Phone #: [phone] (Reference #"s: [id number]                [id number])

## 2021-02-11 NOTE — Telephone Encounter (Signed)
I contacted [facility]- they were needing the sig for the Amlodipine medication.  Gave instructions from previous med list.  Gave 30 day supply, advising patient they needed a FOLLOW UP visit for further refills.   Pharmacy tech verbalized understanding.

## 2021-03-08 ENCOUNTER — Other Ambulatory Visit: Payer: Self-pay | Admitting: Cardiology

## 2021-04-05 ENCOUNTER — Other Ambulatory Visit: Payer: Self-pay | Admitting: Cardiology

## 2021-04-19 ENCOUNTER — Other Ambulatory Visit: Payer: Self-pay

## 2021-04-19 ENCOUNTER — Telehealth: Payer: Self-pay | Admitting: Cardiology

## 2021-04-19 MED ORDER — OLMESARTAN MEDOXOMIL 40 MG PO TABS
ORAL_TABLET | ORAL | 0 refills | Status: DC
Start: 2021-04-19 — End: 2021-07-11

## 2021-04-19 MED ORDER — AMLODIPINE BESYLATE 10 MG PO TABS: ORAL_TABLET | ORAL | 2 refills | Status: DC

## 2021-04-19 NOTE — Telephone Encounter (Signed)
*  STAT* If patient is at the pharmacy, call can be transferred to refill team.   1. Which medications need to be refilled? (please list name of each medication and dose if known) amlodipine, olmesartan   2. Which pharmacy/location (including street and city if local pharmacy) is medication to be sent to? [facility]   3. Do they need a 30 day or 90 day supply? 90   Patient is out.

## 2021-05-03 ENCOUNTER — Other Ambulatory Visit: Payer: Self-pay | Admitting: Cardiology

## 2021-06-14 ENCOUNTER — Telehealth: Payer: Self-pay | Admitting: Cardiology

## 2021-06-14 NOTE — Telephone Encounter (Signed)
Called  - phone - busy signal no  answering machine

## 2021-06-14 NOTE — Telephone Encounter (Signed)
  Pt c/o medication issue:  1. Name of Medication:   amLODipine (NORVASC) 10 MG tablet    2. How are you currently taking this medication (dosage and times per day)? TAKE 1 TABLET DAILY  3. Are you having a reaction (difficulty breathing--STAT)?   4. What is your medication issue? Pt said she stopped taking this meds a week ago because she was having leg swelling when taking this meds. Now she is having heart fluttering

## 2021-06-28 NOTE — Telephone Encounter (Signed)
Unable to contact pt..will await pt callback 

## 2021-07-11 ENCOUNTER — Other Ambulatory Visit: Payer: Self-pay | Admitting: Cardiology

## 2021-08-05 ENCOUNTER — Other Ambulatory Visit: Payer: Self-pay | Admitting: Cardiology

## 2021-08-10 ENCOUNTER — Other Ambulatory Visit: Payer: Self-pay | Admitting: Cardiology

## 2021-08-26 ENCOUNTER — Encounter: Payer: Self-pay | Admitting: Cardiology

## 2021-08-26 ENCOUNTER — Ambulatory Visit (INDEPENDENT_AMBULATORY_CARE_PROVIDER_SITE_OTHER): Payer: BC Managed Care – PPO | Admitting: Cardiology

## 2021-08-26 ENCOUNTER — Other Ambulatory Visit: Payer: Self-pay

## 2021-08-26 VITALS — BP 148/88 | HR 67 | Ht 65.5 in | Wt 196.6 lb

## 2021-08-26 DIAGNOSIS — I10 Essential (primary) hypertension: Secondary | ICD-10-CM

## 2021-08-26 DIAGNOSIS — R002 Palpitations: Secondary | ICD-10-CM

## 2021-08-26 DIAGNOSIS — M7989 Other specified soft tissue disorders: Secondary | ICD-10-CM | POA: Diagnosis not present

## 2021-08-26 DIAGNOSIS — E785 Hyperlipidemia, unspecified: Secondary | ICD-10-CM | POA: Diagnosis not present

## 2021-08-26 DIAGNOSIS — E669 Obesity, unspecified: Secondary | ICD-10-CM

## 2021-08-26 MED ORDER — AMLODIPINE BESYLATE 2.5 MG PO TABS
2.5000 mg | ORAL_TABLET | Freq: Every day | ORAL | 1 refills | Status: DC
Start: 2021-08-26 — End: 2021-09-19

## 2021-08-26 MED ORDER — AMLODIPINE BESYLATE 2.5 MG PO TABS: 2.5000 mg | ORAL_TABLET | Freq: Every day | ORAL | 3 refills | Status: DC

## 2021-08-26 NOTE — Progress Notes (Signed)
Primary Care Provider: Sharilyn Sites, MD Cardiologist: Glenetta Hew, MD Electrophysiologist: None  Clinic Note: Chief Complaint  Patient presents with   Follow-up    18 months   Hypertension    Stopped taking amlodipine because of edema.   Palpitations    Well-controlled with beta-blocker.   ===================================  ASSESSMENT/PLAN   Problem List Items Addressed This Visit       Cardiology Problems   Essential hypertension (Chronic)    Unfortunately, she did not tolerate the higher dose of amlodipine which was keeping her blood pressure well controlled.  I think she needs something in addition to the Benicar, spironolactone and Toprol.  Cannot really increase Toprol (if necessary could convert to carvedilol).  Plan: Restart amlodipine 2.5 mg daily-reevaluate in 3 months.      Relevant Medications   amLODipine (NORVASC) 2.5 MG tablet   Hyperlipidemia with target LDL less than 100 (Chronic)    Labs were checked in November 2021 by PCP.  LDL down to 78 on rosuvastatin.  Notable improvement since switching from pravastatin to rosuvastatin.  Cramps are also notably stable.  Needs to be reevaluated, but will defer to PCP.      Relevant Medications   amLODipine (NORVASC) 2.5 MG tablet     Other   Palpitations - Primary (Chronic)    Well-controlled on current dose of Toprol.  I am reluctant to switch to a different beta-blocker for fear of losing this control.      Relevant Orders   EKG 12-Lead (Completed)   Swelling of both lower and upper extremities (Chronic)    Have been doing very well with spironolactone.  I think we will simply keep it as a standing medication if her pressures not adequately controlled with the addition of amlodipine. Discussed support stockings and foot elevation.  She did not tolerate HCTZ and I am reluctant to use furosemide.      Obesity (BMI 30-39.9) (Chronic)    Stable weight now since her previous weight loss.  BMI is down  32.  Discussed importance of continuing her dietary modification and increasing exercise       ===================================  HPI:    [name] is a 56 y.o. female with a PMH notable for hypertension hyperlipidemia as well as palpitations who presents today for 14-month follow-up.  [name] was last seen on 01/06/2020-doing well.  Blood pressure is pretty well controlled.  Cramping..  Using PRN spironolactone. She was taking Toprol 50 mg daily, Benicar 40, amlodipine 10 mg, Aldactone 25 mg.  Did not tolerate HCTZ.  Noticed swelling in her legs and we recommended support stockings and foot elevation.  Converted to rosuvastatin from pravastatin.  Recent Hospitalizations:  [facility] 01/04/20[facility]: Cough and shortness of breath or chest pain.  Was recovering from [place] from the 16th.  Noted some oxygen desaturation referred to ER. => Negative CT scan for PE.  Cardiac enzymes were negative.  Thought to be related to bronchitis.  Placed on albuterol, Mucinex and prednisone  Reviewed  CV studies:    The following studies were reviewed today: (if available, images/films reviewed: From Epic Chart or Care Everywhere) None:   Interval History:   [name] returns here today for follow-up.  She is doing pretty well.  She says her swelling little worse, so she simply stopped taking amlodipine. She says her palpitations are pretty well controlled with the current dose of Toprol.  She has not had to take any additional  dosing.  What she does feel is not very bothersome to her. She denies any headache or blurred vision.  No syncope or near syncope.  No CHF or anginal symptoms. She is a little deconditioned little bit of exertional dyspnea but nothing significant.  CV Review of Symptoms (Summary) Cardiovascular ROS: positive for - dyspnea on exertion, edema, palpitations, and all pretty well controlled.  Edema is better since she stopped taking 10  mg. negative for - chest pain, irregular heartbeat, orthopnea, paroxysmal nocturnal dyspnea, rapid heart rate, shortness of breath, or lightheadedness, dizziness or wooziness, syncope/near syncope or TIA/amaurosis fugax, claudication  REVIEWED OF SYSTEMS   Review of Systems  Constitutional:  Negative for malaise/fatigue and weight loss.  Respiratory: Negative.    Cardiovascular:  Positive for leg swelling (Per HPI).  Genitourinary: Negative.   Musculoskeletal:  Positive for myalgias (Mild cramping). Negative for falls and joint pain.  Neurological:  Negative for dizziness, focal weakness and weakness.  Psychiatric/Behavioral: Negative.     I have reviewed and (if needed) personally updated the patient's problem list, medications, allergies, past medical and surgical history, social and family history.   PAST MEDICAL HISTORY   Past Medical History:  Diagnosis Date   Constipation - functional    Ectopic pregnancy    Essential hypertension    Hyperlipidemia LDL goal <130    Obesity    Palpitations    Normal echocardiogram with no valvular lesions. Normal nuclear stress test in March 2011 and August 2014   Stress incontinence    Thyroid disease    isolated, one month only, not on synthroid since 2013    PAST SURGICAL HISTORY   Past Surgical History:  Procedure Laterality Date   CardioNet Monitor  July 2014    Sinus rhythm, sinus tachycardia.  Shortness of breath, dizziness, and chest pain noted with sinus tachycardia   CHOLECYSTECTOMY     COLONOSCOPY N/A 02/06/2014   SLF:1. Normal mucosa in the terrminal ileum 2. The LEFT colon is redundant 3. The colon mucosa was otherwise normal 4. Moderate sizxed internal hemorrhoids 5. No source for change in the bowel habits identified. constipation most likely due to functional constipation.    DOPPLER ECHOCARDIOGRAPHY  March 2011; July 2014   Normal EF, essentially normal; no valvular lesions.  No PFO or ASD   LAPAROSCOPIC ENDOMETRIOSIS  FULGURATION  1990   Treadmill Myoview Nuclear Stress Test  March 2011, August 2014   a) 2011: 6 minutes, 7 METS; Diaphragmatic attenuation otherwise no evidence of ischemia or infarction; b) 2014: Walked at 9 min, 10.1 METs, no ischemia or infarction with normal EF 66%. No EKG changes.    Immunization History  Administered Date(s) Administered   [employer] Vaccination 01/31/2020    MEDICATIONS/ALLERGIES   Current Meds  Medication Sig   amLODipine (NORVASC) 2.5 MG tablet Take 1 tablet (2.5 mg total) by mouth daily.   diphenhydrAMINE (BENADRYL) 25 MG tablet Take 25 mg by mouth every 6 (six) hours as needed.   levothyroxine (SYNTHROID) 50 MCG tablet Take 50 mcg by mouth daily.   metoprolol succinate (TOPROL-XL) 50 MG 24 hr tablet TAKE 1 TABLET DAILY (NEED OFFICE VISIT)   olmesartan (BENICAR) 40 MG tablet TAKE 1 TABLET BY MOUTH EVERY DAY (NEEDS APPT)   rosuvastatin (CRESTOR) 20 MG tablet TAKE 1 TABLET DAILY   spironolactone (ALDACTONE) 25 MG tablet Take 1 tablet (25 mg total) by mouth daily as needed.   [DISCONTINUED] amLODipine (NORVASC) 10 MG tablet TAKE 1 TABLET BY MOUTH  EVERY DAY   [DISCONTINUED] amLODipine (NORVASC) 2.5 MG tablet Take 1 tablet (2.5 mg total) by mouth daily.   [DISCONTINUED] Cholecalciferol (VITAMIN D3) 1.25 MG (50000 UT) CAPS Take 1 capsule by mouth once a week.   [DISCONTINUED] KLOR-CON M20 20 MEQ tablet TAKE 1 TABLET DAILY   [DISCONTINUED] tiZANidine (ZANAFLEX) 4 MG capsule Take 1 capsule (4 mg total) by mouth 2 (two) times daily as needed for muscle spasms.    Allergies  Allergen Reactions   Doxycycline Itching and Rash    SOCIAL HISTORY/FAMILY HISTORY   Reviewed in Epic:  Pertinent findings:  Social History   Tobacco Use   Smoking status: Never   Smokeless tobacco: Never  Substance Use Topics   Alcohol use: No    Alcohol/week: 0.0 standard drinks   Drug use: No   Social History   Social History Narrative   Remain since her last visit.  Her husband and is [place]grand [place].      Is now working on exercising regularly doing walks for 45 to 60 minutes a day 5 days a week.    OBJCTIVE -PE, EKG, labs   Wt Readings from Last 3 Encounters:  08/26/21 196 lb 9.6 oz (89.2 kg)  11/18/20 198 lb (89.8 kg)  01/06/20 194 lb (88 kg)    Physical Exam: BP (!) 148/88    Pulse 67    Ht 5' 5.5" (1.664 m)    Wt 196 lb 9.6 oz (89.2 kg)    LMP 05/08/2017    SpO2 98%    BMI 32.22 kg/m  Physical Exam Vitals reviewed.  Constitutional:      General: She is not in acute distress.    Appearance: Normal appearance. She is obese. She is not ill-appearing (Well-nourished, well-groomed.  Healthy-appearing.) or toxic-appearing.  HENT:     Head: Normocephalic and atraumatic.  Cardiovascular:     Rate and Rhythm: Normal rate and regular rhythm.     Pulses: Normal pulses.     Heart sounds: Normal heart sounds. No murmur heard.   No friction rub. No gallop.  Pulmonary:     Effort: Pulmonary effort is normal. No respiratory distress.     Breath sounds: Normal breath sounds. No wheezing, rhonchi or rales.  Chest:     Chest wall: No tenderness.  Musculoskeletal:        General: No swelling. Normal range of motion.     Cervical back: Normal range of motion and neck supple.  Skin:    General: Skin is warm and dry.  Neurological:     General: No focal deficit present.     Mental Status: She is alert and oriented to person, place, and time.     Gait: Gait normal.  Psychiatric:        Mood and Affect: Mood normal.        Behavior: Behavior normal.        Thought Content: Thought content normal.        Judgment: Judgment normal.    Adult ECG Report  Rate: 67;  Rhythm: normal sinus rhythm and left atrial enlargement, cannot rule out anterior MI age-indeterminate. ; Otherwise normal axis, intervals and durations.  Narrative Interpretation: Stable  Recent Labs:   Reviewed Lab Results  Component Value Date   CHOL 147 06/22/2020   HDL 55 06/22/2020    LDLCALC 78 06/22/2020   TRIG 70 06/22/2020   CHOLHDL 2.7 06/22/2020   Lab Results  Component Value Date   CREATININE 0.98 06/22/2020  BUN 9 06/22/2020   NA 141 06/22/2020   K 4.1 06/22/2020   CL 106 06/22/2020   CO2 25 06/22/2020   CBC Latest Ref Rng & Units 06/04/2017 07/14/2013 01/21/2013  WBC 4.0 - 10.5 K/uL 7.2 11.4(H) 8.8  Hemoglobin 12.0 - 15.0 g/dL 12.4 11.5(L) 11.9(L)  Hematocrit 36.0 - 46.0 % 37.9 33.6(L) 35.0(L)  Platelets 150 - 400 K/uL 271 292 281    No results found for: HGBA1C No results found for: TSH  ==================================================  COVID-19 Education: The signs and symptoms of COVID-19 were discussed with the patient and how to seek care for testing (follow up with PCP or arrange E-visit).    I spent a total of 21 minutes with the patient spent in direct patient consultation.  Additional time spent with chart review  / charting (studies, outside notes, etc): 12 min Total Time: 33 min  Current medicines are reviewed at length with the patient today.  (+/- concerns) none-stopped amlodipine because of edema.  Not taking for maximum daily.  This visit occurred during the SARS-CoV-2 public health emergency.  Safety protocols were in place, including screening questions prior to the visit, additional usage of staff PPE, and extensive cleaning of exam room while observing appropriate contact time as indicated for disinfecting solutions.  Notice: This dictation was prepared with Dragon dictation along with smart phrase technology. Any transcriptional errors that result from this process are unintentional and may not be corrected upon review.  Studies Ordered:   Orders Placed This Encounter  Procedures   EKG 12-Lead    Patient Instructions / Medication Changes & Studies & Tests Ordered   Patient Instructions  Medication Instructions:   START  TAKING AMLODIPINE 2.5 mg daily  *If you need a refill on your cardiac medications before your next  appointment, please call your pharmacy*   Lab Work:  NOT NEEDED    Testing/Procedures:  Not needed  Follow-Up: At [facility], you and your health needs are our priority.  As part of our continuing mission to provide you with exceptional heart care, we have created designated Provider Care Teams.  These Care Teams include your primary Cardiologist (physician) and Advanced Practice Providers (APPs -  Physician Assistants and Nurse Practitioners) who all work together to provide you with the care you need, when you need it.     Your next appointment:   3 month(s)  The format for your next appointment:   In Person  Provider:   Glenetta Hew, MD         Glenetta Hew, M.D., M.S. Interventional Cardiologist   Pager # [phone] Phone # [phone] [address] [place], [place] [address]   Thank you for choosing Heartcare at [facility]!!

## 2021-08-26 NOTE — Patient Instructions (Addendum)
Medication Instructions:   START  TAKING AMLODIPINE 2.5 mg daily  *If you need a refill on your cardiac medications before your next appointment, please call your pharmacy*   Lab Work:  NOT NEEDED    Testing/Procedures:  Not needed  Follow-Up: At [facility], you and your health needs are our priority.  As part of our continuing mission to provide you with exceptional heart care, we have created designated Provider Care Teams.  These Care Teams include your primary Cardiologist (physician) and Advanced Practice Providers (APPs -  Physician Assistants and Nurse Practitioners) who all work together to provide you with the care you need, when you need it.     Your next appointment:   3 month(s)  The format for your next appointment:   In Person  Provider:   Bryan Lemma, MD

## 2021-09-14 ENCOUNTER — Other Ambulatory Visit: Payer: Self-pay | Admitting: Cardiology

## 2021-09-19 ENCOUNTER — Other Ambulatory Visit: Payer: Self-pay | Admitting: Cardiology

## 2021-10-10 ENCOUNTER — Encounter: Payer: Self-pay | Admitting: Cardiology

## 2021-10-10 NOTE — Assessment & Plan Note (Signed)
Stable weight now since her previous weight loss.  BMI is down 32.  Discussed importance of continuing her dietary modification and increasing exercise

## 2021-10-10 NOTE — Assessment & Plan Note (Signed)
Well-controlled on current dose of Toprol.  I am reluctant to switch to a different beta-blocker for fear of losing this control.

## 2021-10-10 NOTE — Assessment & Plan Note (Signed)
Unfortunately, she did not tolerate the higher dose of amlodipine which was keeping her blood pressure well controlled.  I think she needs something in addition to the Benicar, spironolactone and Toprol.  Cannot really increase Toprol (if necessary could convert to carvedilol).  Plan: Restart amlodipine 2.5 mg daily-reevaluate in 3 months.

## 2021-10-10 NOTE — Assessment & Plan Note (Signed)
Have been doing very well with spironolactone.  I think we will simply keep it as a standing medication if her pressures not adequately controlled with the addition of amlodipine. Discussed support stockings and foot elevation.  She did not tolerate HCTZ and I am reluctant to use furosemide.

## 2021-10-10 NOTE — Assessment & Plan Note (Signed)
Labs were checked in November 2021 by PCP.  LDL down to 78 on rosuvastatin.  Notable improvement since switching from pravastatin to rosuvastatin.  Cramps are also notably stable.  Needs to be reevaluated, but will defer to PCP.

## 2021-10-11 ENCOUNTER — Other Ambulatory Visit: Payer: Self-pay | Admitting: Cardiology

## 2021-11-29 ENCOUNTER — Encounter: Payer: Self-pay | Admitting: Cardiology

## 2021-11-29 ENCOUNTER — Ambulatory Visit (INDEPENDENT_AMBULATORY_CARE_PROVIDER_SITE_OTHER): Payer: BC Managed Care – PPO | Admitting: Cardiology

## 2021-11-29 VITALS — BP 126/80 | HR 76 | Ht 65.0 in | Wt 195.8 lb

## 2021-11-29 DIAGNOSIS — M7989 Other specified soft tissue disorders: Secondary | ICD-10-CM | POA: Diagnosis not present

## 2021-11-29 DIAGNOSIS — R002 Palpitations: Secondary | ICD-10-CM

## 2021-11-29 DIAGNOSIS — E669 Obesity, unspecified: Secondary | ICD-10-CM

## 2021-11-29 DIAGNOSIS — E785 Hyperlipidemia, unspecified: Secondary | ICD-10-CM

## 2021-11-29 DIAGNOSIS — I10 Essential (primary) hypertension: Secondary | ICD-10-CM

## 2021-11-29 MED ORDER — SPIRONOLACTONE 25 MG PO TABS: 12.5000 mg | ORAL_TABLET | Freq: Every day | ORAL | 3 refills | Status: DC

## 2021-11-29 NOTE — Patient Instructions (Addendum)
Medication Instructions:  ? ?  START TAKING  SPIRONOLACTONE 12.5 GM ( 1/2 TABLET OF 25 MG ) TAKE AT LUNCH TIME ? ?*If you need a refill on your cardiac medications before your next appointment, please call your pharmacy* ? ? ?Lab Work: ?BMP - IN 2 WEEKS  ? ?If you have labs (blood work) drawn today and your tests are completely normal, you will receive your results only by: ?MyChart Message (if you have MyChart) OR ?A paper copy in the mail ?If you have any lab test that is abnormal or we need to change your treatment, we will call you to review the results. ? ? ?Testing/Procedures: ? ?NOT NEEDED ? ?Follow-Up: ?At [facility], you and your health needs are our priority.  As part of our continuing mission to provide you with exceptional heart care, we have created designated Provider Care Teams.  These Care Teams include your primary Cardiologist (physician) and Advanced Practice Providers (APPs -  Physician Assistants and Nurse Practitioners) who all work together to provide you with the care you need, when you need it. ? ?  ? ?Your next appointment:   ?6 month(s) ? ?The format for your next appointment:   ?In Person ? ?Provider:   ?Dr Herbie Baltimore ? ? ? ?

## 2021-11-29 NOTE — Assessment & Plan Note (Addendum)
Still has swelling @ end of day - but better than on reduced dose of amlodipine.  Will start spironolactone 12.5 mg (1/2 tab of 25 mg) q Lunch.  (Had not been taking it very routinely, so we will make a standing dose)  Recheck BMP in ~2 wks.

## 2021-11-29 NOTE — Progress Notes (Signed)
Primary Care Provider: Assunta FoundGolding, John, MD Cardiologist: Bryan Lemmaavid Meyli Boice, MD Electrophysiologist: None  Clinic Note: Chief Complaint  Patient presents with   Follow-up    Swelling better on lower dose of amlodipine, still has daily swelling.   Hypertension    Blood pressure is better.   ===================================  ASSESSMENT/PLAN   Problem List Items Addressed This Visit       Cardiology Problems   Essential hypertension - Primary (Chronic)    Blood pressure still looks pretty good on the combination of Benicar and Toprol with lower dose amlodipine.  She actually is not taking spironolactone regularly.  With her having persistent edema, I would have her start taking 12 and half of spironolactone daily with additional half dose as needed. Recheck chemistry panel in 2 weeks       Relevant Medications   spironolactone (ALDACTONE) 25 MG tablet   Other Relevant Orders   Basic metabolic panel   Hyperlipidemia with target LDL less than 100 (Chronic)    Labs just checked by PCP in November 2022 with LDL 85.  Up a little bit from the previous year, but relatively stable. Strader mains on rosuvastatin 20 mg daily.  Tolerating it well.       Relevant Medications   spironolactone (ALDACTONE) 25 MG tablet     Other   Palpitations (Chronic)    Patient reported much nonexistent now on Toprol.  Thankfully, she is not really having that much in the way of any exercise fatigue.  No signs of chronotropic incompetence.       Relevant Orders   Basic metabolic panel   Swelling of both lower and upper extremities (Chronic)    Still has swelling @ end of day - but better than on reduced dose of amlodipine.  Will start spironolactone 12.5 mg (1/2 tab of 25 mg) q Lunch.  (Had not been taking it very routinely, so we will make a standing dose)  Recheck BMP in ~2 wks.        Relevant Orders   Basic metabolic panel   Obesity (BMI 16-10.930-39.9) (Chronic)    Stable weight now.   Continue to recommend weight loss.        ===================================  HPI:    [name] is a 56 y.o. female with a PMH notable for hypertension hyperlipidemia as well as palpitations who presents today for 887-month follow-up.  [name] was last seen on 01/06/2020-doing well.  Blood pressure is pretty well controlled.  Cramping..  Using PRN spironolactone. She was taking Toprol 50 mg daily, Benicar 40, amlodipine 10 mg, Aldactone 25 mg.  Did not tolerate HCTZ.  Noticed swelling in her legs and we recommended support stockings and foot elevation.  Converted to rosuvastatin from pravastatin.  Recent Hospitalizations:  [facility] 01/04/20[facility]: Cough and shortness of breath or chest pain.  Was recovering from COVID from the 16th.  Noted some oxygen desaturation referred to ER. => Negative CT scan for PE.  Cardiac enzymes were negative.  Thought to be related to bronchitis.  Placed on albuterol, Mucinex and prednisone  She was last seen on 08/26/2021  follow-up.  She is doing pretty well.  She says her swelling little worse, so she simply stopped taking amlodipine. She says her palpitations are pretty well controlled with the current dose of Toprol.  She has not had to take any additional dosing.  What she does feel is not very bothersome to her. She denies any headache or blurred  vision.  No syncope or near syncope.  No CHF or anginal symptoms. She is a little deconditioned little bit of exertional dyspnea but nothing significant. Restarted Amlodipine 2.5 mg daily.  Reviewed  CV studies:    The following studies were reviewed today: (if available, images/films reviewed: From Epic Chart or Care Everywhere) None:  Interval History:   [name] returns here today for follow-up doing a lot better..  Still has swelling - worse @ end of day. - but notably better than on the 10 mg dose, (she did not tolerate HCTZ).  She has not really been  taking that much in the way of PRN spironolactone. No more / notably improved DOE.  Palpitations still pretty well controlled with current dose of Toprol.  CV Review of Symptoms (Summary) Cardiovascular ROS: no chest pain or dyspnea on exertion positive for - edema, palpitations, and all pretty well controlled.  Edema is better on the lower dose of amlodipine. negative for - irregular heartbeat, orthopnea, paroxysmal nocturnal dyspnea, rapid heart rate, shortness of breath, or lightheadedness, dizziness or wooziness, syncope/near syncope or TIA/amaurosis fugax, claudication  REVIEWED OF SYSTEMS   Review of Systems  Constitutional:  Negative for malaise/fatigue and weight loss.  Respiratory: Negative.    Cardiovascular:  Positive for leg swelling (Per HPI).  Genitourinary: Negative.   Musculoskeletal:  Positive for myalgias (Mild cramping). Negative for falls and joint pain.  Neurological:  Negative for dizziness, focal weakness and weakness.  Psychiatric/Behavioral: Negative.     I have reviewed and (if needed) personally updated the patient's problem list, medications, allergies, past medical and surgical history, social and family history.   PAST MEDICAL HISTORY   Past Medical History:  Diagnosis Date   Constipation - functional    Ectopic pregnancy    Essential hypertension    Hyperlipidemia LDL goal <130    Obesity    Palpitations    Normal echocardiogram with no valvular lesions. Normal nuclear stress test in March 2011 and August 2014   Stress incontinence    Thyroid disease    isolated, one month only, not on synthroid since 2013    PAST SURGICAL HISTORY   Past Surgical History:  Procedure Laterality Date   CardioNet Monitor  July 2014    Sinus rhythm, sinus tachycardia.  Shortness of breath, dizziness, and chest pain noted with sinus tachycardia   CHOLECYSTECTOMY     COLONOSCOPY N/A 02/06/2014   SLF:1. Normal mucosa in the terrminal ileum 2. The LEFT colon is  redundant 3. The colon mucosa was otherwise normal 4. Moderate sizxed internal hemorrhoids 5. No source for change in the bowel habits identified. constipation most likely due to functional constipation.    DOPPLER ECHOCARDIOGRAPHY  March 2011; July 2014   Normal EF, essentially normal; no valvular lesions.  No PFO or ASD   LAPAROSCOPIC ENDOMETRIOSIS FULGURATION  1990   Treadmill Myoview Nuclear Stress Test  March 2011, August 2014   a) 2011: 6 minutes, 7 METS; Diaphragmatic attenuation otherwise no evidence of ischemia or infarction; b) 2014: Walked at 9 min, 10.1 METs, no ischemia or infarction with normal EF 66%. No EKG changes.    Immunization History  Administered Date(s) Administered   [employer] Vaccination 01/31/2020    MEDICATIONS/ALLERGIES   Current Meds  Medication Sig   amLODipine (NORVASC) 2.5 MG tablet TAKE 1 TABLET BY MOUTH EVERY DAY   diphenhydrAMINE (BENADRYL) 25 MG tablet Take 25 mg by mouth every 6 (six) hours as needed.  KLOR-CON M20 20 MEQ tablet TAKE 1 TABLET DAILY (PLEASE SCHEDULE APPOINTMENT FOR FUTURE REFILLS)   levothyroxine (SYNTHROID) 50 MCG tablet Take 50 mcg by mouth daily.   metoprolol succinate (TOPROL-XL) 50 MG 24 hr tablet TAKE 1 TABLET DAILY (NEED OFFICE VISIT)   olmesartan (BENICAR) 40 MG tablet TAKE 1 TABLET BY MOUTH EVERY DAY   rosuvastatin (CRESTOR) 20 MG tablet TAKE 1 TABLET DAILY    Allergies  Allergen Reactions   Doxycycline Itching and Rash    SOCIAL HISTORY/FAMILY HISTORY   Reviewed in Epic:  Pertinent findings:  Social History   Tobacco Use   Smoking status: Never   Smokeless tobacco: Never  Substance Use Topics   Alcohol use: No    Alcohol/week: 0.0 standard drinks   Drug use: No   Social History   Social History Narrative   Remain since her last visit. Her husband and is [name].      Is now working on exercising regularly doing walks for 45 to 60 minutes a day 5 days a week.    OBJCTIVE -PE, EKG, labs    Wt Readings from Last 3 Encounters:  11/29/21 195 lb 12.8 oz (88.8 kg)  08/26/21 196 lb 9.6 oz (89.2 kg)  11/18/20 198 lb (89.8 kg)    Physical Exam: BP 126/80   Pulse 76   Ht  (1.651 m)   Wt 195 lb 12.8 oz (88.8 kg)   LMP 05/08/2017   SpO2 96%   BMI 32.58 kg/m  Physical Exam Vitals reviewed.  Constitutional:      General: She is not in acute distress.    Appearance: Normal appearance. She is obese. She is not ill-appearing (Well-nourished, well-groomed.  Healthy-appearing.) or toxic-appearing.  HENT:     Head: Normocephalic and atraumatic.  Cardiovascular:     Rate and Rhythm: Normal rate and regular rhythm.     Pulses: Normal pulses.     Heart sounds: Normal heart sounds. No murmur heard.   No friction rub. No gallop.  Pulmonary:     Effort: Pulmonary effort is normal. No respiratory distress.     Breath sounds: Normal breath sounds. No wheezing, rhonchi or rales.  Chest:     Chest wall: No tenderness.  Musculoskeletal:        General: Swelling (Maybe trivial) present. Normal range of motion.     Cervical back: Normal range of motion and neck supple.  Skin:    General: Skin is warm and dry.  Neurological:     General: No focal deficit present.     Mental Status: She is alert and oriented to person, place, and time. Mental status is at baseline.     Gait: Gait normal.  Psychiatric:        Mood and Affect: Mood normal.        Behavior: Behavior normal.        Thought Content: Thought content normal.        Judgment: Judgment normal.    Adult ECG Report N/a  Recent Labs:   Reviewed 06/28/2021: TC 152, TG 91, HDL 50, LDL 85.  Patient to be 12.2 10/20/2021: A1c 6.1; Cr 0.91, K+ 3.8, TSH 1.02 Lab Results  Component Value Date   CHOL 147 06/22/2020   HDL 55 06/22/2020   LDLCALC 78 06/22/2020   TRIG 70 06/22/2020   CHOLHDL 2.7 06/22/2020   Lab Results  Component Value Date   CREATININE 0.98 06/22/2020   BUN 9 06/22/2020   NA 141  06/22/2020   K 4.1  06/22/2020   CL 106 06/22/2020   CO2 25 06/22/2020      Latest Ref Rng & Units 06/04/2017    2:01 PM 07/14/2013    1:08 AM 01/21/2013    4:28 PM  CBC  WBC 4.0 - 10.5 K/uL 7.2   11.4   8.8    Hemoglobin 12.0 - 15.0 g/dL 16.1   09.6   04.5    Hematocrit 36.0 - 46.0 % 37.9   33.6   35.0    Platelets 150 - 400 K/uL 271   292   281      No results found for: HGBA1C No results found for: TSH  ==================================================  COVID-19 Education: The signs and symptoms of COVID-19 were discussed with the patient and how to seek care for testing (follow up with PCP or arrange E-visit).    I spent a total of 24 minutes with the patient spent in direct patient consultation.  Additional time spent with chart review  / charting (studies, outside notes, etc): 13 min Total Time: 37 min  Current medicines are reviewed at length with the patient today.  (+/- concerns) none-stopped amlodipine because of edema.  Not taking for maximum daily.   Notice: This dictation was prepared with Dragon dictation along with smart phrase technology. Any transcriptional errors that result from this process are unintentional and may not be corrected upon review.  Studies Ordered:   Orders Placed This Encounter  Procedures   Basic metabolic panel   Meds ordered this encounter  Medications   spironolactone (ALDACTONE) 25 MG tablet    Sig: Take 0.5 tablets (12.5 mg total) by mouth daily after lunch.    Dispense:  45 tablet    Refill:  3    RX CHANGED     Patient Instructions / Medication Changes & Studies & Tests Ordered   Patient Instructions  Medication Instructions:     START TAKING  SPIRONOLACTONE 12.5 GM ( 1/2 TABLET OF 25 MG ) TAKE AT LUNCH TIME  *If you need a refill on your cardiac medications before your next appointment, please call your pharmacy*   Lab Work: BMP - IN 2 WEEKS   If you have labs (blood work) drawn today and your tests are completely normal, you will  receive your results only by: MyChart Message (if you have MyChart) OR A paper copy in the mail If you have any lab test that is abnormal or we need to change your treatment, we will call you to review the results.   Testing/Procedures:  NOT NEEDED  Follow-Up: At [facility], you and your health needs are our priority.  As part of our continuing mission to provide you with exceptional heart care, we have created designated Provider Care Teams.  These Care Teams include your primary Cardiologist (physician) and Advanced Practice Providers (APPs -  Physician Assistants and Nurse Practitioners) who all work together to provide you with the care you need, when you need it.     Your next appointment:   6 month(s)  The format for your next appointment:   In Person  Provider:   Dr Lawernce Pitts, M.D., M.S. Interventional Cardiologist   Pager # [phone] Phone # [phone] [address] [place], [place] [address]   Thank you for choosing Heartcare at [facility]!!

## 2022-01-01 ENCOUNTER — Encounter: Payer: Self-pay | Admitting: Cardiology

## 2022-01-01 NOTE — Assessment & Plan Note (Signed)
Stable weight now.  Continue to recommend weight loss.

## 2022-01-01 NOTE — Assessment & Plan Note (Signed)
Labs just checked by PCP in November 2022 with LDL 85.  Up a little bit from the previous year, but relatively stable. Strader mains on rosuvastatin 20 mg daily.  Tolerating it well.

## 2022-01-01 NOTE — Assessment & Plan Note (Signed)
Blood pressure still looks pretty good on the combination of Benicar and Toprol with lower dose amlodipine.  She actually is not taking spironolactone regularly.  With her having persistent edema, I would have her start taking 12 and half of spironolactone daily with additional half dose as needed. Recheck chemistry panel in 2 weeks

## 2022-01-01 NOTE — Assessment & Plan Note (Signed)
Patient reported much nonexistent now on Toprol.  Thankfully, she is not really having that much in the way of any exercise fatigue.  No signs of chronotropic incompetence.

## 2022-01-31 ENCOUNTER — Other Ambulatory Visit: Payer: Self-pay | Admitting: Cardiology

## 2022-03-06 ENCOUNTER — Telehealth: Payer: Self-pay | Admitting: Cardiology

## 2022-03-06 NOTE — Telephone Encounter (Signed)
STAT if HR is under 50 or over 120 (normal HR is 60-100 beats per minute)  What is your heart rate? 120  Do you have a log of your heart rate readings (document readings)?  121 130 122  Do you have any other symptoms?  Lightheadedness and fatigue

## 2022-03-06 NOTE — Telephone Encounter (Signed)
Will look forward to Caitlin's evaluation - sounds like she may need another monitor - ? Was this SVT or Afib/Flutter or simply sinus tachycardia Bryan Lemma

## 2022-03-06 NOTE — Telephone Encounter (Signed)
Patient reports that on Thursday, her heart went up to the 130s, BP 139/94 to 148/100. Reports nausea, fatigue, lightheadedness, and exertional sob (going up stairs). She went to the ED in [place] on 7/20. While scheduling patient, she informed me that 2 weeks ago, she fell backward at home and hit the back of her head on the refrigerator. She has pain in her back and sides form the fall. She stated she didn't tell them at the ED. Made appointment with Brunetta Genera, NP for 7/28. Recommended to patient that if she has headache, vomiting, blurred vision, dizziness, she needs to go to the ED to be evaluated for head injury. Explained that with head injuries, symptoms don't always show up immediatly.Patient verbalized understanding.

## 2022-03-07 IMAGING — CT CT ABD-PELV W/ CM
2 of 5 series · 17 of 46 positions shown, 19 images · IV contrast (Omnipaque or Isovue)
Comparison: February 09, 2014.

CLINICAL DATA: Left lower quadrant abdominal pain.

EXAM:
CT ABDOMEN AND PELVIS WITH CONTRAST
TECHNIQUE: Multidetector CT imaging of the abdomen and pelvis was performed
using the standard protocol following bolus administration of
intravenous contrast.
CONTRAST:  100mL OMNIPAQUE IOHEXOL 300 MG/ML  SOLN

[Series 2: axial st · axial · 0.84mm/px · z∈[+800,+1210]mm · 14 of 94 slices shown, 16 images]
[im 6/94  soft-tissue]
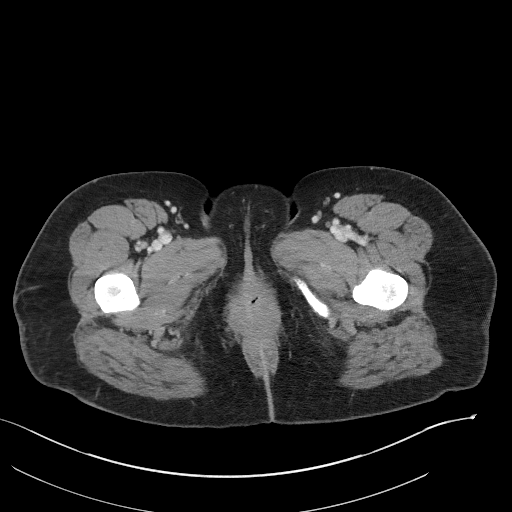
[im 6/94  bone]
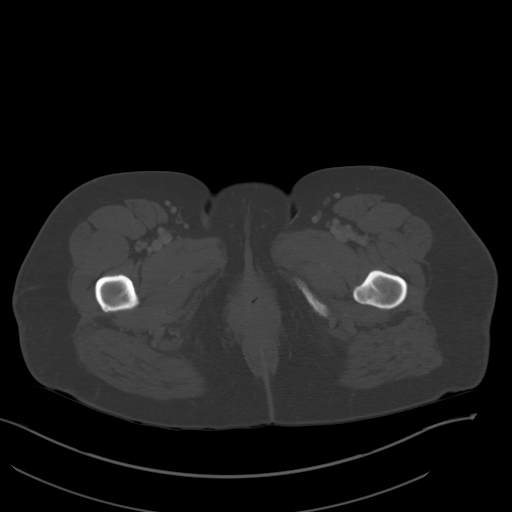
[im 11/94  soft-tissue]
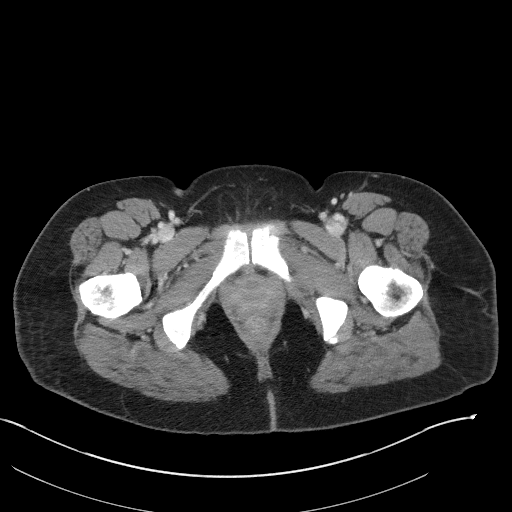
[im 17/94  soft-tissue]
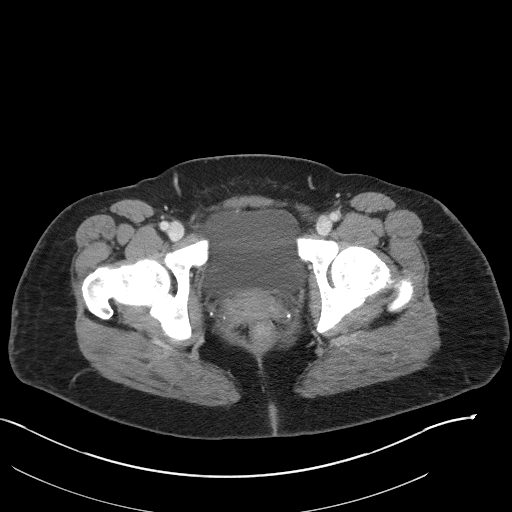
[im 28/94  soft-tissue]
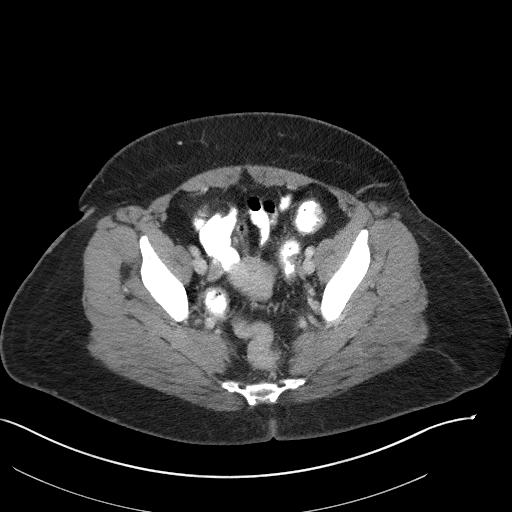
[im 33/94  soft-tissue]
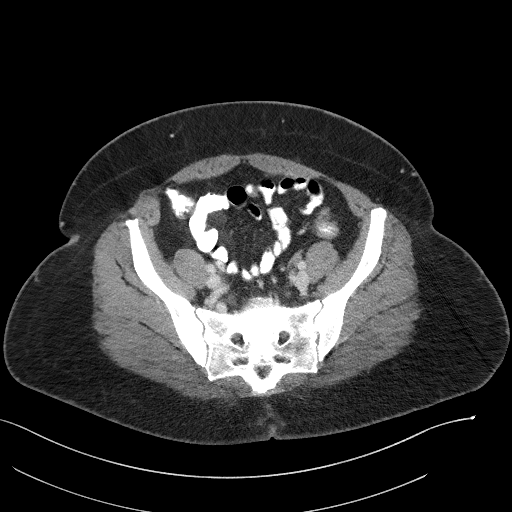
[im 39/94  soft-tissue]
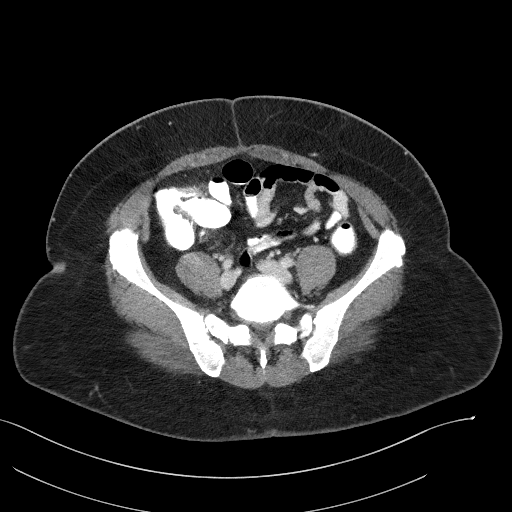
[im 44/94  soft-tissue]
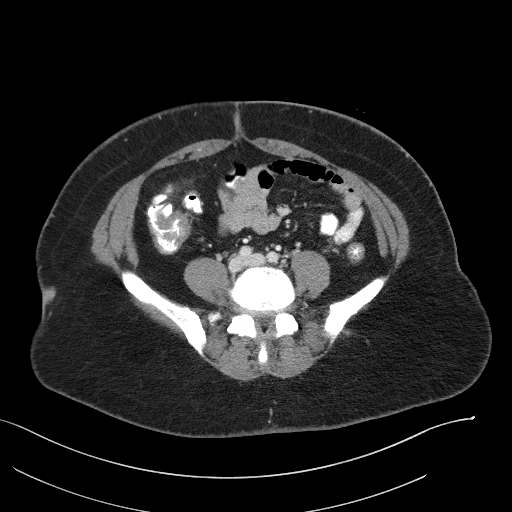
[im 50/94  soft-tissue]
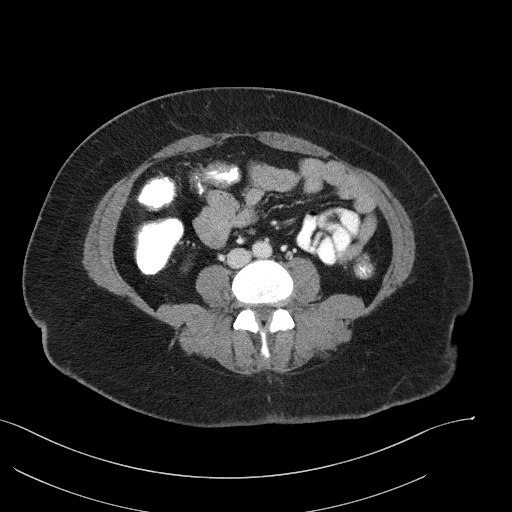
[im 55/94  soft-tissue]
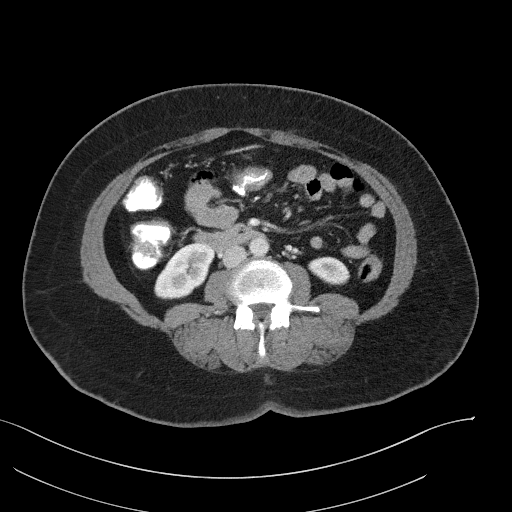
[im 55/94  bone]
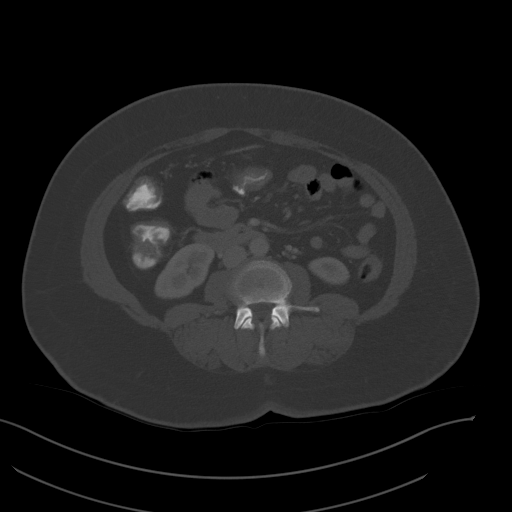
[im 61/94  soft-tissue]
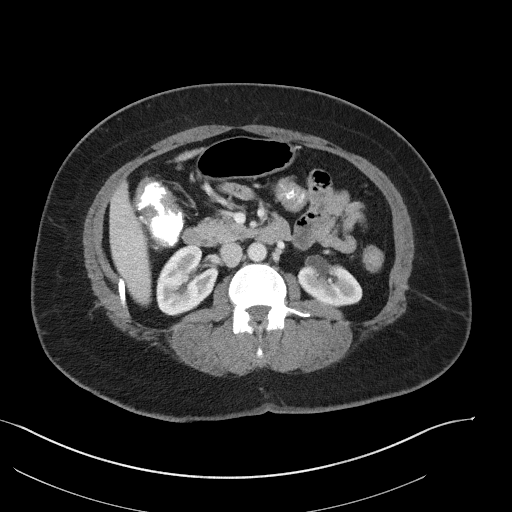
[im 72/94  soft-tissue]
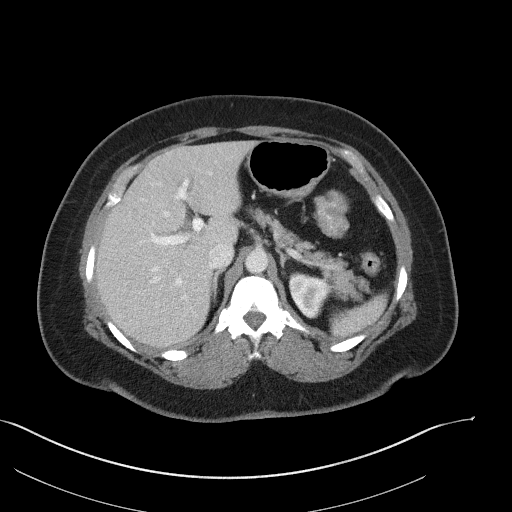
[im 77/94  soft-tissue]
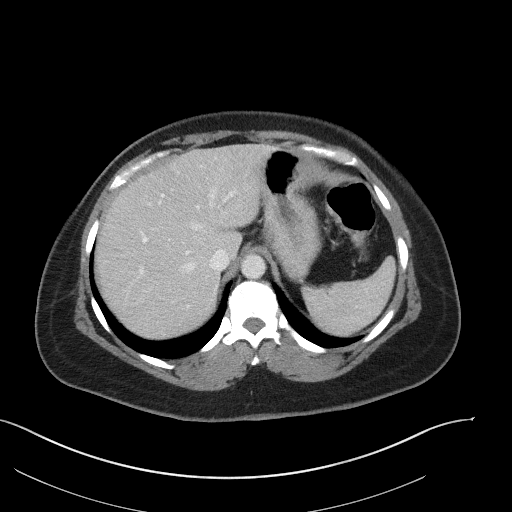
[im 83/94  soft-tissue]
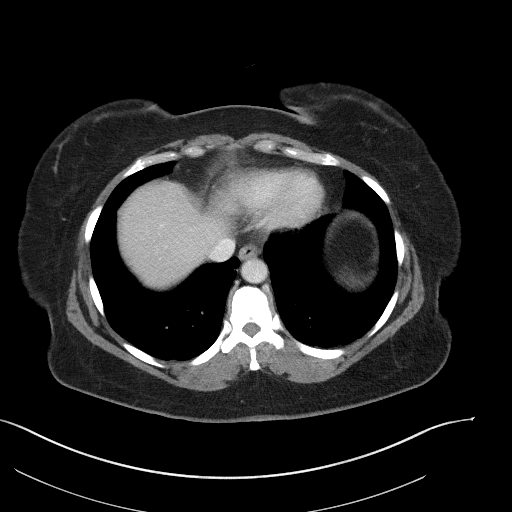
[im 88/94  soft-tissue]
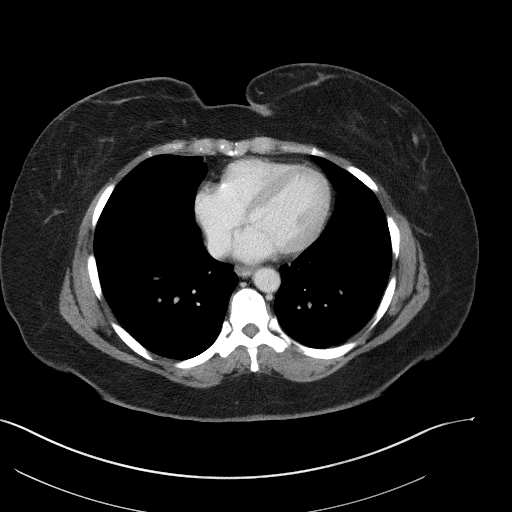

[Series 5: coronal st · coronal · 0.87mm/px · 3 of 108 slices shown]
[im 36/108  soft-tissue]
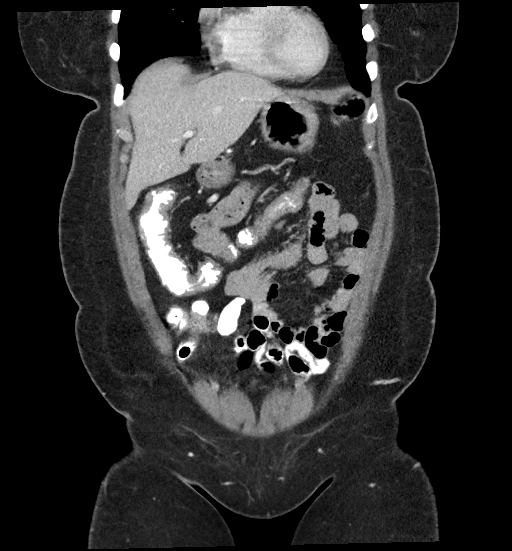
[im 48/108  soft-tissue]
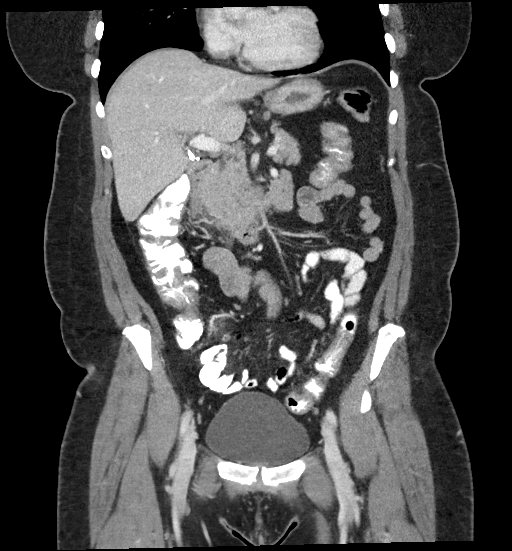
[im 60/108  soft-tissue]
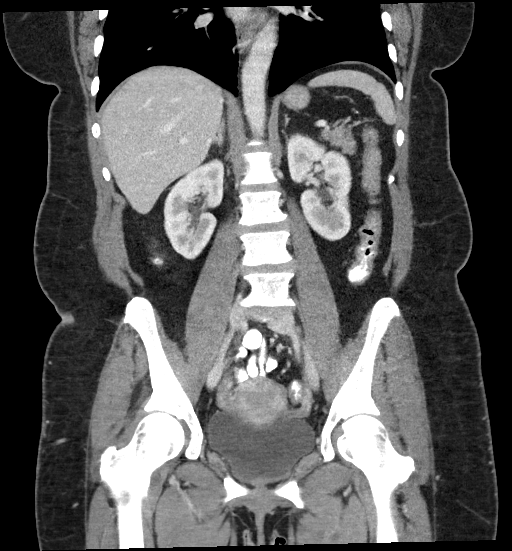

[17 of 46 positions shown; findings below may reference images not displayed]

FINDINGS: Lower chest: No acute abnormality.

Hepatobiliary: No focal liver abnormality is seen. Status post
cholecystectomy. No biliary dilatation.

Pancreas: Unremarkable. No pancreatic ductal dilatation or
surrounding inflammatory changes.

Spleen: Normal in size without focal abnormality.

Adrenals/Urinary Tract: Adrenal glands are unremarkable. Kidneys are
normal, without renal calculi, focal lesion, or hydronephrosis.
Bladder is unremarkable.

Stomach/Bowel: Stomach is within normal limits. Appendix appears
normal. No evidence of bowel wall thickening, distention, or
inflammatory changes.

Vascular/Lymphatic: No significant vascular findings are present. No
enlarged abdominal or pelvic lymph nodes.

Reproductive: Uterus and bilateral adnexa are unremarkable.

Other: No abdominal wall hernia or abnormality. No abdominopelvic
ascites.

Musculoskeletal: No acute or significant osseous findings.
IMPRESSION: No acute abnormality seen in the abdomen or pelvis.

## 2022-03-08 ENCOUNTER — Ambulatory Visit
Admission: RE | Admit: 2022-03-08 | Discharge: 2022-03-08 | Disposition: A | Discharge status: Home / Self Care | Payer: BC Managed Care – PPO | Source: Ambulatory Visit | Attending: Family Medicine | Admitting: Family Medicine

## 2022-03-08 ENCOUNTER — Other Ambulatory Visit: Payer: Self-pay | Admitting: Family Medicine

## 2022-03-08 DIAGNOSIS — G4452 New daily persistent headache (NDPH): Secondary | ICD-10-CM

## 2022-03-08 DIAGNOSIS — R42 Dizziness and giddiness: Secondary | ICD-10-CM

## 2022-03-10 ENCOUNTER — Encounter (HOSPITAL_BASED_OUTPATIENT_CLINIC_OR_DEPARTMENT_OTHER): Payer: Self-pay | Admitting: Family

## 2022-03-10 ENCOUNTER — Ambulatory Visit (INDEPENDENT_AMBULATORY_CARE_PROVIDER_SITE_OTHER): Payer: BC Managed Care – PPO

## 2022-03-10 ENCOUNTER — Ambulatory Visit (INDEPENDENT_AMBULATORY_CARE_PROVIDER_SITE_OTHER): Payer: BC Managed Care – PPO | Admitting: Family

## 2022-03-10 VITALS — BP 130/72 | HR 71 | Ht 65.0 in | Wt 183.0 lb

## 2022-03-10 DIAGNOSIS — R002 Palpitations: Secondary | ICD-10-CM | POA: Diagnosis not present

## 2022-03-10 DIAGNOSIS — M7989 Other specified soft tissue disorders: Secondary | ICD-10-CM | POA: Diagnosis not present

## 2022-03-10 DIAGNOSIS — E785 Hyperlipidemia, unspecified: Secondary | ICD-10-CM

## 2022-03-10 DIAGNOSIS — I10 Essential (primary) hypertension: Secondary | ICD-10-CM | POA: Diagnosis not present

## 2022-03-10 MED ORDER — METOPROLOL SUCCINATE ER 50 MG PO TB24: 50.0000 mg | ORAL_TABLET | Freq: Every day | ORAL | 3 refills | Status: DC

## 2022-03-10 MED ORDER — METOPROLOL TARTRATE 25 MG PO TABS: 25.0000 mg | ORAL_TABLET | Freq: Two times a day (BID) | ORAL | 1 refills | Status: DC | PRN

## 2022-03-10 NOTE — Progress Notes (Signed)
Office Visit    Patient Name: [name] Date of Encounter: 03/10/2022  PCP:  Assunta Found, MD   [facility]  Cardiologist:  Bryan Lemma, MD  Advanced Practice Provider:  No care team member to display Electrophysiologist:  None   Chief Complaint    LAWRIE TUNKS is a 56 y.o. female presents today for tachycardia, palpitations.    Past Medical History    Past Medical History:  Diagnosis Date   Constipation - functional    Ectopic pregnancy    Essential hypertension    Hyperlipidemia LDL goal <130    Obesity    Palpitations    Normal echocardiogram with no valvular lesions. Normal nuclear stress test in March 2011 and August 2014   Stress incontinence    Thyroid disease    isolated, one month only, not on synthroid since 2013   Past Surgical History:  Procedure Laterality Date   CardioNet Monitor  July 2014    Sinus rhythm, sinus tachycardia.  Shortness of breath, dizziness, and chest pain noted with sinus tachycardia   CHOLECYSTECTOMY     COLONOSCOPY N/A 02/06/2014   SLF:1. Normal mucosa in the terrminal ileum 2. The LEFT colon is redundant 3. The colon mucosa was otherwise normal 4. Moderate sizxed internal hemorrhoids 5. No source for change in the bowel habits identified. constipation most likely due to functional constipation.    DOPPLER ECHOCARDIOGRAPHY  March 2011; July 2014   Normal EF, essentially normal; no valvular lesions.  No PFO or ASD   LAPAROSCOPIC ENDOMETRIOSIS FULGURATION  1990   Treadmill Myoview Nuclear Stress Test  March 2011, August 2014   a) 2011: 6 minutes, 7 METS; Diaphragmatic attenuation otherwise no evidence of ischemia or infarction; b) 2014: Walked at 9 min, 10.1 METs, no ischemia or infarction with normal EF 66%. No EKG changes.    Allergies  Allergies  Allergen Reactions   Doxycycline Itching and Rash    History of Present Illness    [name] is a 56 y.o. female with a hx  of HTN, HLD, palpitations, edema, obesity last seen 11/29/21 by Dr. Herbie Baltimore.  At last clinic visit 11/29/21 her palpitations were well controlled on Toprol 50mg  QD. She was recommended to take Spironolactone 12.5mg  QD and additional 12.5mg  PRN.   ED visit 03/02/22 at [facility] for tachycardia. She did not stay to be evaluated. Labs in ED 03/03/22 revealed Hb 12.3, wbc 6.7, K 4.4, creatinine 0.97, ALT 20, AST 22, GFR 68. CXR no acute findings.   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 When describing the episode tells me that evening around 7pm didn't feel well. Noted her BP was high and HR 120s. She called her daughter who is a nurse who told her to lay down. Even later around 9pm could feel her heart racing. Chest felt "squeezy" and hands went numb. She felt lightheaded, but not near syncope nor syncope. Notes lightheaded when getting out of car after long commute, this has been ongoing for some time.  Tells me this feels similar to her previous palpitations. 2 weeks ago also notes she had a fall and hit the back of her head on refridgerator. She has had persistent headaches. CT head earlier this week ordered by PCP unremarkable per her report. No caffeine nor etoh use. Notes intermittent LE edema for which she is taking her Spironolactone 2-3 times per week. She does note that she feels she has more cramps with the spironolactone and  as such, does not take daily. Drinks between 64 and 100 oz of water per day.   EKGs/Labs/Other Studies Reviewed:   The following studies were reviewed today:  EKG:  EKG is ordered today.  The ekg ordered today demonstrates NSR 71 bpm with Qtc 479.  Recent Labs: No results found for requested labs within last 365 days.  Recent Lipid Panel    Component Value Date/Time   CHOL 147 06/22/2020 1618   TRIG 70 06/22/2020 1618   HDL 55 06/22/2020 1618   CHOLHDL 2.7 06/22/2020 1618   LDLCALC 78 06/22/2020 1618     Home Medications   Current Meds  Medication Sig   amLODipine  (NORVASC) 2.5 MG tablet TAKE 1 TABLET BY MOUTH EVERY DAY   Baclofen 5 MG TABS Take 1 tablet by mouth daily as needed.   Cholecalciferol (VITAMIN D3) 50 MCG (2000 UT) TABS Take 1 tablet by mouth daily.   diphenhydrAMINE (BENADRYL) 25 MG tablet Take 25 mg by mouth every 6 (six) hours as needed.   KLOR-CON M20 20 MEQ tablet TAKE 1 TABLET DAILY (PLEASE SCHEDULE APPOINTMENT FOR FUTURE REFILLS)   levothyroxine (SYNTHROID) 50 MCG tablet Take 50 mcg by mouth daily.   LINZESS 290 MCG CAPS capsule Take 290 mcg by mouth as needed.   metoprolol succinate (TOPROL-XL) 50 MG 24 hr tablet TAKE 1 TABLET DAILY (NEED OFFICE VISIT)   olmesartan (BENICAR) 40 MG tablet TAKE 1 TABLET BY MOUTH EVERY DAY   rosuvastatin (CRESTOR) 20 MG tablet TAKE 1 TABLET BY MOUTH EVERY DAY   spironolactone (ALDACTONE) 25 MG tablet Take 0.5 tablets (12.5 mg total) by mouth daily after lunch.     Review of Systems      All other systems reviewed and are otherwise negative except as noted above.  Physical Exam    VS:  BP 130/72 (BP Location: Left Arm, Patient Position: Sitting, Cuff Size: Large)   Pulse 71   Ht 5\' 5"  (1.651 m)   Wt 183 lb (83 kg)   LMP 05/08/2017   BMI 30.45 kg/m  , BMI Body mass index is 30.45 kg/m.  Wt Readings from Last 3 Encounters:  03/10/22 183 lb (83 kg)  11/29/21 195 lb 12.8 oz (88.8 kg)  08/26/21 196 lb 9.6 oz (89.2 kg)    GEN: Well nourished, well developed, in no acute distress. HEENT: normal. Neck: Supple, no JVD, carotid bruits, or masses. Cardiac: RRR, no murmurs, rubs, or gallops. No clubbing, cyanosis, edema.  Radials/PT 2+ and equal bilaterally.  Respiratory:  Respirations regular and unlabored, clear to auscultation bilaterally. GI: Soft, nontender, nondistended. MS: No deformity or atrophy. Skin: Warm and dry, no rash. Neuro:  Strength and sensation are intact. Psych: Normal affect.  Assessment & Plan    Palpitations- Episode of tachycardia which sent her to ED. CBC, BMP, CXR  unremarkable. TSH earlier this week at PCP normal.Does note recent stress of injury where she fell on a wet floor and hit her head (CT head unremarkable). Consider etiology PAT vs SVT vs PAF. 14 day ZIO placed in clinic. Continue Toprol 50mg  QD. We discussed potentially increasing dose, but she prefers pill-in-pocket method. Add Metoprolol tartrate 25mg  BID PRN for breakthrough tachycardia. Update magnesium level. If monitor with significant arrhythmia, plan for echo. She does not drink caffeine nor etoh. No noted proarrhythmic agents.   HTN - BP well controlled. Continue current antihypertensive regimen of Amlodipine 2.91m QD, Olmesartan 40mg  QD.   Prolonged QT - QTc 479 by Bazett  formula. 03/03/22 normal potassium. Update magnesium level today.   HLD - Continue Rosuvastatin 20mg  QD.   LE edema -controlled on Spironolactone 12.5mg  PRN.Taking 3 times per week. Prefers to avoid daily dosing. Notes leg cramps - recent potassium level normal, will update magnesium level.     Disposition: Follow up in 6 week(s) with , MD or APP.  Signed, Bryan Lemma, NP 03/10/2022, 2:26 PM [facility]

## 2022-03-10 NOTE — Patient Instructions (Signed)
Medication Instructions:  Your physician has recommended you make the following change in your medication:   CONTINUE Toprol 50mg  daily  START Metoprolol tartrate (Lopressor) 25mg  as needed for palpitations.   *If you need a refill on your cardiac medications before your next appointment, please call your pharmacy*   Lab Work: Your physician recommends that you return for lab work today: magnesium  If you have labs (blood work) drawn today and your tests are completely normal, you will receive your results only by: MyChart Message (if you have MyChart) OR A paper copy in the mail If you have any lab test that is abnormal or we need to change your treatment, we will call you to review the results.   Testing/Procedures: Your physician has recommended that you wear a Zio monitor.   This monitor is a medical device that records the heart's electrical activity. Doctors most often use these monitors to diagnose arrhythmias. Arrhythmias are problems with the speed or rhythm of the heartbeat. The monitor is a small device applied to your chest. You can wear one while you do your normal daily activities. While wearing this monitor if you have any symptoms to push the button and record what you felt. Once you have worn this monitor for the period of time provider prescribed (Usually 14 days), you will return the monitor device in the postage paid box. Once it is returned they will download the data collected and provide with a report which the provider will then review and we will call you with those results. Important tips:  Avoid showering during the first 24 hours of wearing the monitor. Avoid excessive sweating to help maximize wear time. Do not submerge the device, no hot tubs, and no swimming pools. Keep any lotions or oils away from the patch. After 24 hours you may shower with the patch on. Take brief showers with your back facing the shower head.  Do not remove patch once it has been  placed because that will interrupt data and decrease adhesive wear time. Push the button when you have any symptoms and write down what you were feeling. Once you have completed wearing your monitor, remove and place into box which has postage paid and place in your outgoing mailbox.  If for some reason you have misplaced your box then call our office and we can provide another box and/or mail it off for you.      Follow-Up: At [facility], you and your health needs are our priority.  As part of our continuing mission to provide you with exceptional heart care, we have created designated Provider Care Teams.  These Care Teams include your primary Cardiologist (physician) and Advanced Practice Providers (APPs -  Physician Assistants and Nurse Practitioners) who all work together to provide you with the care you need, when you need it.  We recommend signing up for the patient portal called "MyChart".  Sign up information is provided on this After Visit Summary.  MyChart is used to connect with patients for Virtual Visits (Telemedicine).  Patients are able to view lab/test results, encounter notes, upcoming appointments, etc.  Non-urgent messages can be sent to your provider as well.   To learn more about what you can do with MyChart, go to [place].    Your next appointment:   6 week(s)  The format for your next appointment:   In Person  Provider:   Dr. [facility] or [email or web address], NP     Other Instructions  To prevent palpitations: Make sure you are adequately hydrated.  Avoid and/or limit caffeine containing beverages like soda or tea. Exercise regularly.  Manage stress well. Some over the counter medications can cause palpitations such as Benadryl, AdvilPM, TylenolPM. Regular Advil or Tylenol do not cause palpitations.

## 2022-03-11 LAB — MAGNESIUM: Magnesium: 2.4 mg/dL — ABNORMAL HIGH (ref 1.6–2.3)

## 2022-03-14 LAB — BASIC METABOLIC PANEL
BUN/Creatinine Ratio: 13 (ref 9–23)
BUN: 12 mg/dL (ref 6–24)
CO2: 24 mmol/L (ref 20–29)
Calcium: 9.8 mg/dL (ref 8.7–10.2)
Chloride: 105 mmol/L (ref 96–106)
Creatinine, Ser: 0.94 mg/dL (ref 0.57–1.00)
Glucose: 104 mg/dL — ABNORMAL HIGH (ref 70–99)
Potassium: 4.4 mmol/L (ref 3.5–5.2)
Sodium: 142 mmol/L (ref 134–144)
eGFR: 70 mL/min/{1.73_m2} (ref >59)

## 2022-03-24 ENCOUNTER — Encounter (HOSPITAL_BASED_OUTPATIENT_CLINIC_OR_DEPARTMENT_OTHER): Payer: Self-pay

## 2022-04-01 ENCOUNTER — Other Ambulatory Visit (HOSPITAL_BASED_OUTPATIENT_CLINIC_OR_DEPARTMENT_OTHER): Payer: Self-pay | Admitting: Family

## 2022-04-03 NOTE — Telephone Encounter (Signed)
Rx request sent to pharmacy.  

## 2022-04-14 ENCOUNTER — Other Ambulatory Visit: Payer: Self-pay | Admitting: Cardiology

## 2022-04-21 ENCOUNTER — Ambulatory Visit (INDEPENDENT_AMBULATORY_CARE_PROVIDER_SITE_OTHER): Payer: BC Managed Care – PPO | Admitting: Family

## 2022-04-21 ENCOUNTER — Encounter (HOSPITAL_BASED_OUTPATIENT_CLINIC_OR_DEPARTMENT_OTHER): Payer: Self-pay | Admitting: Family

## 2022-04-21 VITALS — BP 142/86 | HR 78 | Ht 65.0 in | Wt 175.6 lb

## 2022-04-21 DIAGNOSIS — I10 Essential (primary) hypertension: Secondary | ICD-10-CM

## 2022-04-21 DIAGNOSIS — M7989 Other specified soft tissue disorders: Secondary | ICD-10-CM

## 2022-04-21 DIAGNOSIS — R002 Palpitations: Secondary | ICD-10-CM | POA: Diagnosis not present

## 2022-04-21 NOTE — Patient Instructions (Signed)
Medication Instructions:  Your Physician recommend you continue on your current medication as directed.    *If you need a refill on your cardiac medications before your next appointment, please call your pharmacy*  Follow-Up: At [facility], you and your health needs are our priority.  As part of our continuing mission to provide you with exceptional heart care, we have created designated Provider Care Teams.  These Care Teams include your primary Cardiologist (physician) and Advanced Practice Providers (APPs -  Physician Assistants and Nurse Practitioners) who all work together to provide you with the care you need, when you need it.  We recommend signing up for the patient portal called "MyChart".  Sign up information is provided on this After Visit Summary.  MyChart is used to connect with patients for Virtual Visits (Telemedicine).  Patients are able to view lab/test results, encounter notes, upcoming appointments, etc.  Non-urgent messages can be sent to your provider as well.   To learn more about what you can do with MyChart, go to [email or web address].    Your next appointment:   6 month(s)  The format for your next appointment:   In Person  Provider:   Bryan Lemma, MD  or Gillian Shields, NP   Other Instructions Heart Healthy Diet Recommendations: A low-salt diet is recommended. Meats should be grilled, baked, or boiled. Avoid fried foods. Focus on lean protein sources like fish or chicken with vegetables and fruits. The American Heart Association is a Chief Technology Officer!  American Heart Association Diet and Lifeystyle Recommendations   Exercise recommendations: The American Heart Association recommends 150 minutes of moderate intensity exercise weekly. Try 30 minutes of moderate intensity exercise 4-5 times per week. This could include walking, jogging, or swimming.   Important Information About Sugar

## 2022-04-21 NOTE — Progress Notes (Signed)
Office Visit    Patient Name: [name] Date of Encounter: 04/21/2022  PCP:  Assunta Found, MD   [facility]  Cardiologist:  Bryan Lemma, MD  Advanced Practice Provider:  No care team member to display Electrophysiologist:  None   Chief Complaint    [name] is a 56 y.o. female presents today for tachycardia, palpitations.    Past Medical History    Past Medical History:  Diagnosis Date   Constipation - functional    Ectopic pregnancy    Essential hypertension    Hyperlipidemia LDL goal <130    Obesity    Palpitations    Normal echocardiogram with no valvular lesions. Normal nuclear stress test in March 2011 and August 2014   Stress incontinence    Thyroid disease    isolated, one month only, not on synthroid since 2013   Past Surgical History:  Procedure Laterality Date   CardioNet Monitor  July 2014    Sinus rhythm, sinus tachycardia.  Shortness of breath, dizziness, and chest pain noted with sinus tachycardia   CHOLECYSTECTOMY     COLONOSCOPY N/A 02/06/2014   SLF:1. Normal mucosa in the terrminal ileum 2. The LEFT colon is redundant 3. The colon mucosa was otherwise normal 4. Moderate sizxed internal hemorrhoids 5. No source for change in the bowel habits identified. constipation most likely due to functional constipation.    DOPPLER ECHOCARDIOGRAPHY  March 2011; July 2014   Normal EF, essentially normal; no valvular lesions.  No PFO or ASD   LAPAROSCOPIC ENDOMETRIOSIS FULGURATION  1990   Treadmill Myoview Nuclear Stress Test  March 2011, August 2014   a) 2011: 6 minutes, 7 METS; Diaphragmatic attenuation otherwise no evidence of ischemia or infarction; b) 2014: Walked at 9 min, 10.1 METs, no ischemia or infarction with normal EF 66%. No EKG changes.    Allergies  Allergies  Allergen Reactions   Doxycycline Itching and Rash    History of Present Illness    [name] is a 56 y.o. female with a hx  of HTN, HLD, palpitations, edema, obesity last seen 03/10/22.  At last clinic visit 11/29/21 her palpitations were well controlled on Toprol 50mg  QD. She was recommended to take Spironolactone 12.5mg  QD and additional 12.5mg  PRN.   [facility] visit [facility]/02/22 at [facility] for tachycardia. She did not stay to be evaluated. Labs in ED 03/03/22 revealed Hb 12.3, wbc 6.7, K 4.4, creatinine 0.97, ALT 20, AST 22, GFR 68. CXR no acute findings.   Seen 02/2022 noting palpitations. Toprol 50mg  continued and PRN Metoprolol Tartrate added. Preliminary monitor with predominantly NSR average heart rate 72 bpm with rare PVC/PAC <1%.   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 We reviewed her ZIO. Has only required PRN palpitations one time. Reports no shortness of breath nor dyspnea on exertion. Exercises by walking her dog. Reports no chest pain, pressure, or tightness. No edema, orthopnea, PND. Stable intermittent LE edema for which she is taking her Spironolactone 2-3 times per week. She does note that she feels she has more cramps with the spironolactone and as such, does not take daily. Drinks between 64 and 100 oz of water per day.   EKGs/Labs/Other Studies Reviewed:   The following studies were reviewed today:  EKG:  No EKG today  Recent Labs: 03/10/2022: Magnesium 2.4 03/14/2022: BUN 12; Creatinine, Ser 0.94; Potassium 4.4; Sodium 142  Recent Lipid Panel    Component Value Date/Time   CHOL 147 06/22/2020  1618   TRIG 70 06/22/2020 1618   HDL 55 06/22/2020 1618   CHOLHDL 2.7 06/22/2020 1618   LDLCALC 78 06/22/2020 1618     Home Medications   Current Meds  Medication Sig   amLODipine (NORVASC) 2.5 MG tablet TAKE 1 TABLET BY MOUTH EVERY DAY   Baclofen 5 MG TABS Take 1 tablet by mouth daily as needed.   Cholecalciferol (VITAMIN D3) 50 MCG (2000 UT) TABS Take 1 tablet by mouth daily.   diphenhydrAMINE (BENADRYL) 25 MG tablet Take 25 mg by mouth every 6 (six) hours as needed.   KLOR-CON M20 20 MEQ tablet TAKE 1 TABLET  DAILY (PLEASE SCHEDULE APPOINTMENT FOR FUTURE REFILLS)   levothyroxine (SYNTHROID) 50 MCG tablet Take 50 mcg by mouth daily.   LINZESS 290 MCG CAPS capsule Take 290 mcg by mouth as needed.   metoprolol succinate (TOPROL-XL) 50 MG 24 hr tablet Take 1 tablet (50 mg total) by mouth daily. Take with or immediately following a meal.   metoprolol tartrate (LOPRESSOR) 25 MG tablet TAKE 1 TABLET (25 MG TOTAL) BY MOUTH 2 (TWO) TIMES DAILY AS NEEDED (PALPITATIONS).   olmesartan (BENICAR) 40 MG tablet TAKE 1 TABLET BY MOUTH EVERY DAY   rosuvastatin (CRESTOR) 20 MG tablet TAKE 1 TABLET BY MOUTH EVERY DAY   spironolactone (ALDACTONE) 25 MG tablet Take 0.5 tablets (12.5 mg total) by mouth daily after lunch.     Review of Systems      All other systems reviewed and are otherwise negative except as noted above.  Physical Exam    VS:  BP (!) 142/86 (BP Location: Right Arm, Patient Position: Sitting, Cuff Size: Normal)   Pulse 78   Ht 5\' 5"  (1.651 m)   Wt 175 lb 9.6 oz (79.7 kg)   LMP 05/08/2017   SpO2 99%   BMI 29.22 kg/m  , BMI Body mass index is 29.22 kg/m.  Wt Readings from Last 3 Encounters:  04/21/22 175 lb 9.6 oz (79.7 kg)  03/10/22 183 lb (83 kg)  11/29/21 195 lb 12.8 oz (88.8 kg)    GEN: Well nourished, well developed, in no acute distress. HEENT: normal. Neck: Supple, no JVD, carotid bruits, or masses. Cardiac: RRR, no murmurs, rubs, or gallops. No clubbing, cyanosis, edema.  Radials/PT 2+ and equal bilaterally.  Respiratory:  Respirations regular and unlabored, clear to auscultation bilaterally. GI: Soft, nontender, nondistended. MS: No deformity or atrophy. Skin: Warm and dry, no rash. Neuro:  Strength and sensation are intact. Psych: Normal affect.  Assessment & Plan    Palpitations- Episode of tachycardia which sent her to ED 03/02/22. Subsequent monitor with NSR average heart rate 72 bpm rare PVC/PAC <1% burden. Palpitations well controlled on Toprol 50mg  QD. Has required PRN  Lopressor once over last 6 weeks. Recent CBC, BMP, mag, TSH unremarkable.  She does not drink caffeine nor etoh. No noted proarrhythmic agents. Encouraged to stay well hydrated, manage stress well.  HTN - BP mildly elevated today though usually controlled. Continue current antihypertensive regimen of Amlodipine 2.25m QD, Olmesartan 40mg  QD. Discussed to monitor BP at home at least 2 hours after medications and sitting for 5-10 minutes. If BP persistently elevated, could increase Amlodipine to 5mg .   HLD - Continue Rosuvastatin 20mg  QD.   Obesity - Weight loss via diet and exercise encouraged. Discussed the impact being overweight would have on cardiovascular risk. Offered referral to PREP which she will consider.   LE edema -controlled on Spironolactone 12.5mg  PRN.Taking 3 times per  week. Prefers to avoid daily dosing. Notes leg cramps - recent potassium and magnesium level normal.   Disposition: Follow up in 6 month(s) with Bryan Lemma, MD or APP.  Signed, Alver Sorrow, NP 04/21/2022, 3:43 PM [facility]

## 2022-04-27 ENCOUNTER — Ambulatory Visit: Payer: BC Managed Care – PPO | Admitting: Podiatry

## 2022-04-28 ENCOUNTER — Other Ambulatory Visit (HOSPITAL_BASED_OUTPATIENT_CLINIC_OR_DEPARTMENT_OTHER): Payer: Self-pay | Admitting: Family

## 2022-04-28 ENCOUNTER — Other Ambulatory Visit (HOSPITAL_BASED_OUTPATIENT_CLINIC_OR_DEPARTMENT_OTHER): Payer: Self-pay | Admitting: Cardiology

## 2022-04-28 ENCOUNTER — Other Ambulatory Visit: Payer: Self-pay | Admitting: Cardiology

## 2022-04-28 NOTE — Telephone Encounter (Signed)
Pt of Dr. Herbie Baltimore. Please review for refills. Thank you!

## 2022-05-04 ENCOUNTER — Ambulatory Visit (INDEPENDENT_AMBULATORY_CARE_PROVIDER_SITE_OTHER): Payer: BC Managed Care – PPO

## 2022-05-04 ENCOUNTER — Encounter: Payer: Self-pay | Admitting: Podiatry

## 2022-05-04 ENCOUNTER — Ambulatory Visit (INDEPENDENT_AMBULATORY_CARE_PROVIDER_SITE_OTHER): Payer: BC Managed Care – PPO | Admitting: Podiatry

## 2022-05-04 DIAGNOSIS — M21619 Bunion of unspecified foot: Secondary | ICD-10-CM

## 2022-05-04 DIAGNOSIS — M21612 Bunion of left foot: Secondary | ICD-10-CM | POA: Diagnosis not present

## 2022-05-04 DIAGNOSIS — M21611 Bunion of right foot: Secondary | ICD-10-CM | POA: Diagnosis not present

## 2022-05-04 DIAGNOSIS — M7752 Other enthesopathy of left foot: Secondary | ICD-10-CM

## 2022-05-04 DIAGNOSIS — M7751 Other enthesopathy of right foot: Secondary | ICD-10-CM | POA: Diagnosis not present

## 2022-05-04 MED ORDER — TRIAMCINOLONE ACETONIDE 10 MG/ML IJ SUSP
20.0000 mg | Freq: Once | INTRAMUSCULAR | Status: AC
  Administered 2022-05-04: 20 mg

## 2022-05-07 NOTE — Progress Notes (Signed)
Subjective:   Patient ID: [name], female   DOB: 56 y.o.   MRN: [id number]   HPI Patient states that she has ankle pain both feet does not remember why it occurred and its been going on for several months.  Overall been doing well with her feet   ROS      Objective:  Physical Exam  Ocular status intact with discomfort into the sinus tarsi bilateral with inflammation with occasional sharp pains and cramps     Assessment:  Inflammatory capsulitis of the sinus tarsi bilateral which may have been brought on by activity or shoe gear usage or combination     Plan:  H&P reviewed conditions and went ahead today did sterile prep and injected the sinus tarsi bilateral 3 mg Kenalog 5 mg Xylocaine and advised on anti-inflammatory usage and support shoes.  Reappoint if symptoms indicate  X-rays were negative for signs of arthritis or any indications of fracture or other bony pathology associated with the condition previous surgery looks stable with no issue

## 2022-05-13 ENCOUNTER — Other Ambulatory Visit (HOSPITAL_BASED_OUTPATIENT_CLINIC_OR_DEPARTMENT_OTHER): Payer: Self-pay | Admitting: Cardiology

## 2022-07-31 ENCOUNTER — Other Ambulatory Visit: Payer: Self-pay | Admitting: Cardiology

## 2022-08-19 NOTE — Progress Notes (Signed)
Monitor with predominantly normal sinus rhythm which is great! No sustained arrhythmias. Continue current medications as discussed in clinic visit.

## 2022-09-11 ENCOUNTER — Other Ambulatory Visit: Payer: Self-pay | Admitting: Cardiology

## 2022-10-31 ENCOUNTER — Other Ambulatory Visit: Payer: Self-pay | Admitting: Cardiology

## 2022-11-01 ENCOUNTER — Encounter: Payer: Self-pay | Admitting: Cardiology

## 2022-11-01 ENCOUNTER — Ambulatory Visit: Payer: 59 | Attending: Cardiology | Admitting: Cardiology

## 2022-11-01 VITALS — BP 152/82 | HR 72 | Ht 66.0 in | Wt 172.0 lb

## 2022-11-01 DIAGNOSIS — M7989 Other specified soft tissue disorders: Secondary | ICD-10-CM

## 2022-11-01 DIAGNOSIS — E669 Obesity, unspecified: Secondary | ICD-10-CM | POA: Diagnosis not present

## 2022-11-01 DIAGNOSIS — R002 Palpitations: Secondary | ICD-10-CM

## 2022-11-01 DIAGNOSIS — E785 Hyperlipidemia, unspecified: Secondary | ICD-10-CM | POA: Diagnosis not present

## 2022-11-01 DIAGNOSIS — I10 Essential (primary) hypertension: Secondary | ICD-10-CM | POA: Diagnosis not present

## 2022-11-01 MED ORDER — SPIRONOLACTONE 25 MG PO TABS: 12.5000 mg | ORAL_TABLET | Freq: Every day | ORAL | 3 refills | Status: DC

## 2022-11-01 MED ORDER — POTASSIUM CHLORIDE CRYS ER 20 MEQ PO TBCR: EXTENDED_RELEASE_TABLET | ORAL | 3 refills | Status: DC

## 2022-11-01 MED ORDER — ROSUVASTATIN CALCIUM 20 MG PO TABS: 20.0000 mg | ORAL_TABLET | Freq: Every day | ORAL | 3 refills | Status: DC

## 2022-11-01 MED ORDER — AMLODIPINE BESYLATE 5 MG PO TABS: 5.0000 mg | ORAL_TABLET | Freq: Every day | ORAL | 3 refills | Status: DC

## 2022-11-01 NOTE — Progress Notes (Signed)
Primary Care Provider: Sharilyn [name], [place] Cardiologist: [name], [name] Electrophysiologist: None  Clinic Note: Chief Complaint  Patient presents with   Follow-up    Roughly 6 months.  No active chest pain or palpitations-rare beta-blocker use.   ===================================  ASSESSMENT/PLAN   Problem List Items Addressed This Visit       Cardiology Problems   Hyperlipidemia with target LDL less than 100 (Chronic)    Last A1c as of November was 5.5 and last LDL was 87.  At goal on current dose of rosuvastatin.  If she continues to lose weight, I suspect that we can probably discontinue on his current dose and she would even get to the target of less than 70.  [name] Kitchen No change      Relevant Medications   rosuvastatin (CRESTOR) 20 MG tablet   spironolactone (ALDACTONE) 25 MG tablet   amLODipine (NORVASC) 5 MG tablet   Essential hypertension - Primary (Chronic)    BP still not quite where would like to be including her home recordings.  Will get an increase her amlodipine back to 5 mg and see how she does.  She did have some swelling of this in the past but hopefully may only require more than just 3 days a week spironolactone though should not be significant      Relevant Medications   rosuvastatin (CRESTOR) 20 MG tablet   spironolactone (ALDACTONE) 25 MG tablet   amLODipine (NORVASC) 5 MG tablet   Other Relevant Orders   EKG 12-Lead     Other   Swelling of both lower and upper extremities (Chronic)    In the past, she had swelling and partly related to amlodipine.   I we will titrate up to 5 mg amlodipine daily.      Any further I would probably want to consider even potentially converting from Lopressor to carvedilol.  For now continue current dosage. Will monitor blood pressure recordings      Palpitations (Chronic)    Palpitations very well-controlled.  Only random rare use of extra dose of metoprolol. No change.  Continue current dose.   25 mg twice daily      Relevant Orders   EKG 12-Lead   Obesity (BMI 30-39.9) (Chronic)    Great job with weight loss.  Now below the obesity criteria.  Continue diet and exercise.,  With continued weight loss that should help her blood pressure, lipids and glycemic control.       ===================================  HPI:    [name] is a 57 y.o. female with a PMH notable for HTN and HLD as well as palpitations who presents today for 8-month follow-up at the request of [name], [name].  [name] was last seen by me November 29, 2021: Doing a lot better.  Still is swelling is worse the day.  Notably improved with 10 mg Lasix dose.  She did not tolerate HCTZ.  Was not taking.  Spironolactone very much.  Overly improved DOE.  Palpitations controlled with Toprol.  She was subsequently seen by Emily Montana, NP on April 21, 2022 for follow-up from ER visit in July at [facility] for tachycardia.  She had a Zio patch monitor.  But since then and only had 1 episode of where she required PRN metoprolol in addition to her standing dose of Toprol.  No dyspnea on exertion.  She exercises walking her dog.  No chest pain pressure or tightness.  No PND, orthopnea with stable  edema.  Taking spironolactone 2-3 times a week.  This leads to less cramping.  Drinking 64 to 100 ounces of water a day.  Well-controlled usually.  Recent Hospitalizations: None  Reviewed  CV studies:    The following studies were reviewed today: (if available, images/films reviewed: From Epic Chart or Care Everywhere) 14-day Zio Patch: 03/10/2022 - 8-06/2022   Predominant underlying rhythm: Sinus.  Heart rate range 46 to 149 bpm and average of 72 bpm.   Rare PACs and PVCs (<1%)   No couplets, triplets or bigeminy/trigeminy   No sustained or nonsustained arrhythmias: Atrial Tachycardia (AT), Supraventricular Tachycardia (SVT), Atrial Fibrillation (A-Fib), Atrial Flutter (A-Flutter), Sustained Ventricular  Tachycardia (VT)   Patient triggered episodes or either with sinus rhythm (or slow sinus tachycardia in the 110s), occasionally with PVCs or PACs.  All benign entities.  Interval History:   [name] returns here today for follow-up.  BP palpitations.  We reviewed her monitor results that are relatively benign.  She says that the palpitations seem to be doing better.  She acknowledges that at that timeframe when she was wearing the monitor, she she just suffered a fall tripped in her kitchen and hurt her back.  She had lots of bruising and was having a lot of pain.  Has not really had palpitations since then.  She said that she is only to take the extra metoprolol 3 times in the last 6 months.  There is 1 episode in January where she felt more palpitations than usual.  She actually surprised her blood pressure today stating that usually it is in the 135/80 mmHg at home.  She denies any headaches or blurred vision.  She actually has been quite active walking about 30 minutes most days although sometimes if she is busy at work she can only get about 20 minutes in.  She is not having any chest tightness /pressure/pain or dyspnea with rest or exertion.  She denies any PND orthopnea but has a stable end of day edema that is relatively benign-well-controlled with 3 days a week spironolactone..  No further palpitation episodes.  No syncope/near syncope or TIA/amaurosis fugax.  No claudication. No further TIA or back symptoms.  No claudication.  What she does have is a sense of ringing in your ears that make it hard to sleep.  She says that this makes her dizziness somewhat but not to the point of syncope or near syncope.  With all her exercise and dietary modification, she has lost almost 23 pounds since April of last year.  She feels like her energy level is improved.  REVIEWED OF SYSTEMS   Review of Systems  Constitutional:  Positive for weight loss. Negative for malaise/fatigue.  HENT:   Positive for ear pain (More of a ringing in her ear is associated with dizziness.). Negative for congestion.   Respiratory:  Negative for shortness of breath.   Gastrointestinal:  Negative for blood in stool, constipation and melena.  Genitourinary:  Negative for hematuria.  Musculoskeletal:  Negative for back pain, falls, joint pain and myalgias (Much less notable cramping and myalgias.).  Neurological:  Positive for dizziness. Negative for weakness.  Endo/Heme/Allergies:  Positive for environmental allergies.  Psychiatric/Behavioral:  Negative for memory loss, substance abuse and suicidal ideas. The patient is not nervous/anxious.    I have reviewed and (if needed) personally updated the patient's problem list, medications, allergies, past medical and surgical history, social and family history.   PAST MEDICAL HISTORY   Past  Medical History:  Diagnosis Date   Constipation - functional    Ectopic pregnancy    Essential hypertension    Hyperlipidemia LDL goal <130    Obesity    Palpitations    Normal echocardiogram with no valvular lesions. Normal nuclear stress test in March 2011 and August 2014   Stress incontinence    Thyroid disease    isolated, one month only, not on synthroid since 2013    PAST SURGICAL HISTORY   Past Surgical History:  Procedure Laterality Date   CardioNet Monitor  July 2014    Sinus rhythm, sinus tachycardia.  Shortness of breath, dizziness, and chest pain noted with sinus tachycardia   CHOLECYSTECTOMY     COLONOSCOPY N/A 02/06/2014   SLF:1. Normal mucosa in the terrminal ileum 2. The LEFT colon is redundant 3. The colon mucosa was otherwise normal 4. Moderate sizxed internal hemorrhoids 5. No source for change in the bowel habits identified. constipation most likely due to functional constipation.    DOPPLER ECHOCARDIOGRAPHY  March 2011; July 2014   Normal EF, essentially normal; no valvular lesions.  No PFO or ASD   LAPAROSCOPIC ENDOMETRIOSIS  FULGURATION  1990   Treadmill Myoview Nuclear Stress Test  March 2011, August 2014   a) 2011: 6 minutes, 7 METS; Diaphragmatic attenuation otherwise no evidence of ischemia or infarction; b) 2014: Walked at 9 min, 10.1 METs, no ischemia or infarction with normal EF 66%. No EKG changes.   MEDICATIONS/ALLERGIES   Current Meds  Medication Sig   amLODipine (NORVASC) 2.5 MG tablet TAKE 1 TABLET BY MOUTH EVERY DAY   Baclofen 5 MG TABS Take 1 tablet by mouth daily as needed.   Cholecalciferol (VITAMIN D3) 50 MCG (2000 UT) TABS Take 1 tablet by mouth daily.   diphenhydrAMINE (BENADRYL) 25 MG tablet Take 25 mg by mouth every 6 (six) hours as needed.   KLOR-CON M20 20 MEQ tablet TAKE 1 TABLET DAILY (PLEASE SCHEDULE APPOINTMENT FOR FUTURE REFILLS)   levothyroxine (SYNTHROID) 50 MCG tablet Take 50 mcg by mouth daily.   LINZESS 290 MCG CAPS capsule Take 290 mcg by mouth as needed.   metoprolol succinate (TOPROL-XL) 50 MG 24 hr tablet Take 1 tablet (50 mg total) by mouth daily.   metoprolol tartrate (LOPRESSOR) 25 MG tablet TAKE 1 TABLET (25 MG TOTAL) BY MOUTH 2 (TWO) TIMES DAILY AS NEEDED (PALPITATIONS). INS REQUIRES 90 DAY SUPPLY AND [name] REFUSED REQUEST   olmesartan (BENICAR) 40 MG tablet TAKE 1 TABLET BY MOUTH EVERY DAY   rosuvastatin (CRESTOR) 20 MG tablet TAKE 1 TABLET (20 MG TOTAL) BY MOUTH DAILY. PLEASE KEEP SCHEDULED APPOINTMENT WITH CARDIOLOGIST   spironolactone (ALDACTONE) 25 MG tablet Take 0.5 tablets (12.5 mg total) by mouth daily after lunch.    Allergies  Allergen Reactions   Doxycycline Itching and Rash    SOCIAL HISTORY/FAMILY HISTORY   Reviewed in Epic:  Pertinent findings:  Social History   Tobacco Use   Smoking status: Never   Smokeless tobacco: Never  Substance Use Topics   Alcohol use: No    Alcohol/week: 0.0 standard drinks of alcohol   Drug use: No   Social History   Social History Narrative   Remain since her last visit. Her husband and is Legrand [place].      Is now  working on exercising regularly doing walks for 45 to 60 minutes a day 5 days a week.    OBJCTIVE -PE, EKG, labs   Wt Readings from Last 3 Encounters:  11/01/22 172 lb (78 kg)  04/21/22 175 lb 9.6 oz (79.7 kg)  03/10/22 183 lb (83 kg)    Physical Exam: BP (!) 152/82   Pulse 72   Ht 5\' 6"  (1.676 m)   Wt 172 lb (78 kg)   LMP 05/08/2017   SpO2 99%   BMI 27.76 kg/m  Physical Exam Vitals reviewed.  Constitutional:      General: She is not in acute distress.    Appearance: Normal appearance. She is normal weight. She is not ill-appearing (Healthy) or toxic-appearing.     Comments: Notable weight loss.  Well-groomed.  Well-nourished.  HENT:     Head: Normocephalic and atraumatic.  Neck:     Vascular: No carotid bruit or JVD.  Cardiovascular:     Rate and Rhythm: Normal rate and regular rhythm. No extrasystoles are present.    Chest Wall: PMI is not displaced.     Pulses: Normal pulses.     Heart sounds: Normal heart sounds, S1 normal and S2 normal. No murmur heard.    No friction rub. No gallop.  Pulmonary:     Effort: Pulmonary effort is normal. No respiratory distress.     Breath sounds: Normal breath sounds. No rhonchi or rales.  Chest:     Chest wall: No tenderness.  Abdominal:     General: Abdomen is flat. There is no distension.     Palpations: Abdomen is soft. There is no mass.     Tenderness: There is no abdominal tenderness. There is no guarding or rebound.     Hernia: No hernia is present.  Musculoskeletal:        General: No swelling. Normal range of motion.     Cervical back: Normal range of motion and neck supple.  Skin:    General: Skin is warm and dry.  Neurological:     General: No focal deficit present.     Mental Status: She is alert and oriented to person, place, and time.  Psychiatric:        Mood and Affect: Mood normal.        Behavior: Behavior normal.        Thought Content: Thought content normal.        Judgment: Judgment normal.      Adult ECG Report  Rate: 72;  Rhythm: normal sinus rhythm and nonspecific ST and T wave changes.  LVH. ;   Narrative Interpretation: Essentially normal study with normal axis, normal durations.  But voltage consistent with LVH.  Recent Labs: Should be due for labs checked soon with PCP Lab Results  Component Value Date   CHOL 147 06/22/2020   HDL 55 06/22/2020   LDLCALC 78 06/22/2020   TRIG 70 06/22/2020   CHOLHDL 2.7 06/22/2020   Lab Results  Component Value Date   CREATININE 0.94 03/14/2022   BUN 12 03/14/2022   NA 142 03/14/2022   K 4.4 03/14/2022   CL 105 03/14/2022   CO2 24 03/14/2022      Latest Ref Rng & Units 06/04/2017    2:01 PM 07/14/2013    1:08 AM 01/21/2013    4:28 PM  CBC  WBC 4.0 - 10.5 K/uL 7.2  11.4  8.8   Hemoglobin 12.0 - 15.0 g/dL 12.4  11.5  11.9   Hematocrit 36.0 - 46.0 % 37.9  33.6  35.0   Platelets 150 - 400 K/uL 271  292  281     No results found for: "HGBA1C" No  results found for: "TSH"  ================================================== I spent a total of 25 minutes with the patient spent in direct patient consultation.  Additional time spent with chart review  / charting (studies, outside notes, etc): 21 min Total Time: 46 min  Current medicines are reviewed at length with the patient today.  (+/- concerns) N/A  Notice: This dictation was prepared with Dragon dictation along with smart phrase technology. Any transcriptional errors that result from this process are unintentional and may not be corrected upon review.  Studies Ordered:   Orders Placed This Encounter  Procedures   EKG 12-Lead   Meds ordered this encounter  Medications   potassium chloride SA (KLOR-CON M20) 20 MEQ tablet    Sig: TAKE 1 TABLET DAILY    Dispense:  90 tablet    Refill:  3   rosuvastatin (CRESTOR) 20 MG tablet    Sig: Take 1 tablet (20 mg total) by mouth daily. Please keep scheduled appointment with cardiologist    Dispense:  90 tablet    Refill:  3    spironolactone (ALDACTONE) 25 MG tablet    Sig: Take 0.5 tablets (12.5 mg total) by mouth daily after lunch.    Dispense:  45 tablet    Refill:  3    RX CHANGED   amLODipine (NORVASC) 5 MG tablet    Sig: Take 1 tablet (5 mg total) by mouth daily.    Dispense:  90 tablet    Refill:  3    Discontinue the 2.5 mg  Rx    Patient Instructions / Medication Changes & Studies & Tests Ordered   Patient Instructions  Medication Instructions:   Increase to 5 mg Amloidpine  *If you need a refill on your cardiac medications before your next appointment, please call your pharmacy*   Lab Work: Not needed    Testing/Procedures: Not needed   Follow-Up: At [facility], you and your health needs are our priority.  As part of our continuing mission to provide you with exceptional heart care, we have created designated Provider Care Teams.  These Care Teams include your primary Cardiologist (physician) and Advanced Practice Providers (APPs -  Physician Assistants and Nurse Practitioners) who all work together to provide you with the care you need, when you need it.     Your next appointment:   6 month(s)  The format for your next appointment:   In Person  Provider:   Jory Sims, DNP, ANP    Then, [name], [name] will plan to see you again in 12 month(s).      Emily Man, MD, [name] [name], M.D., M.S. Interventional Cardiologist  [place]  Pager # [phone] Phone # [phone] [address] [place], [place] [address]   Thank you for choosing [facility]!!

## 2022-11-01 NOTE — Assessment & Plan Note (Signed)
Palpitations very well-controlled.  Only random rare use of extra dose of metoprolol. No change.  Continue current dose.  25 mg twice daily

## 2022-11-01 NOTE — Assessment & Plan Note (Signed)
Last A1c as of November was 5.5 and last LDL was 87.  At goal on current dose of rosuvastatin.  If she continues to lose weight, I suspect that we can probably discontinue on his current dose and she would even get to the target of less than 70.  [name] No change

## 2022-11-01 NOTE — Patient Instructions (Addendum)
Medication Instructions:   Increase to 5 mg Amloidpine  *If you need a refill on your cardiac medications before your next appointment, please call your pharmacy*   Lab Work: Not needed    Testing/Procedures: Not needed   Follow-Up: At [facility], you and your health needs are our priority.  As part of our continuing mission to provide you with exceptional heart care, we have created designated Provider Care Teams.  These Care Teams include your primary Cardiologist (physician) and Advanced Practice Providers (APPs -  Physician Assistants and Nurse Practitioners) who all work together to provide you with the care you need, when you need it.     Your next appointment:   6 month(s)  The format for your next appointment:   In Person  Provider:   Jory Sims, DNP, ANP    Then, Glenetta Hew, MD will plan to see you again in 12 month(s).

## 2022-11-01 NOTE — Assessment & Plan Note (Signed)
BP still not quite where would like to be including her home recordings.  Will get an increase her amlodipine back to 5 mg and see how she does.  She did have some swelling of this in the past but hopefully may only require more than just 3 days a week spironolactone though should not be significant

## 2022-11-02 ENCOUNTER — Encounter: Payer: Self-pay | Admitting: Cardiology

## 2022-11-02 NOTE — Assessment & Plan Note (Signed)
Great job with weight loss.  Now below the obesity criteria.  Continue diet and exercise.,  With continued weight loss that should help her blood pressure, lipids and glycemic control.

## 2022-11-02 NOTE — Assessment & Plan Note (Addendum)
In the past, she had swelling and partly related to amlodipine.   I we will titrate up to 5 mg amlodipine daily.      Any further I would probably want to consider even potentially converting from Lopressor to carvedilol.  For now continue current dosage. Will monitor blood pressure recordings

## 2022-11-30 ENCOUNTER — Ambulatory Visit (INDEPENDENT_AMBULATORY_CARE_PROVIDER_SITE_OTHER): Payer: 59 | Admitting: Podiatry

## 2022-11-30 ENCOUNTER — Ambulatory Visit (INDEPENDENT_AMBULATORY_CARE_PROVIDER_SITE_OTHER): Payer: 59

## 2022-11-30 ENCOUNTER — Encounter: Payer: Self-pay | Admitting: Podiatry

## 2022-11-30 DIAGNOSIS — B070 Plantar wart: Secondary | ICD-10-CM

## 2022-11-30 DIAGNOSIS — M21612 Bunion of left foot: Secondary | ICD-10-CM

## 2022-11-30 DIAGNOSIS — M7751 Other enthesopathy of right foot: Secondary | ICD-10-CM

## 2022-11-30 DIAGNOSIS — M21619 Bunion of unspecified foot: Secondary | ICD-10-CM

## 2022-11-30 DIAGNOSIS — M21611 Bunion of right foot: Secondary | ICD-10-CM

## 2022-11-30 MED ORDER — TRIAMCINOLONE ACETONIDE 10 MG/ML IJ SUSP
10.0000 mg | Freq: Once | INTRAMUSCULAR | Status: AC
Start: 2022-11-30 — End: 2022-11-30
  Administered 2022-11-30: 10 mg

## 2022-11-30 NOTE — Progress Notes (Signed)
Subjective:   Patient ID: [name], female   DOB: 57 y.o.   MRN: [id number]   HPI Patient presents with several painful lesions plantar aspect right foot and inflammation fluid around the second joint right that is been going on for a few months.  States that it has been sore and that it is making it hard for her to walk well.  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

## 2023-01-04 ENCOUNTER — Ambulatory Visit: Payer: 59 | Admitting: Podiatry

## 2023-01-29 ENCOUNTER — Ambulatory Visit: Payer: 59 | Admitting: Podiatry

## 2023-02-01 ENCOUNTER — Other Ambulatory Visit: Payer: Self-pay | Admitting: Cardiology

## 2023-02-27 ENCOUNTER — Telehealth: Payer: Self-pay | Admitting: Internal Medicine

## 2023-02-27 NOTE — Telephone Encounter (Signed)
Good Afternoon Dr. Leonides Schanz,   Patient called stating that Dr. Mitchel Honour had sent a referral over for patient to have a colonoscopy. After looking at patients chart, she has previous GI history with [facility]. Patient stated that Dr. Langston Masker referred her to our practice because her doctor with [facility] had moved. Patient is requesting a female provider and wanted to establish care with you.   Patients records include office visits, as well as a colonoscopy from 2015. Patients records are in [facility], will you please review and advise on scheduling patient?  Thank you.

## 2023-02-27 NOTE — Telephone Encounter (Signed)
Reviewed patient's prior colonoscopy. Looks like she would be due for another colonoscopy in 01/2024 for colon cancer screening. We can put in a recall for her to get a colonoscopy at that time. Or if she has any GI symptoms she would like to discuss in clinic, we can schedule her for an OV.  Colonoscopy 02/06/14: Normal terminal ileum. Redundant left colon. Moderate internal hemorrhoids. Overtube was used due to looping. Good prep. Recommend 10 year follow up colonoscopy

## 2023-02-27 NOTE — Telephone Encounter (Signed)
LVM for patient stating that she is not due for colonoscopy until June of 2025 and if she was having any issues she could call to schedule OV. Placed recall for procedure.

## 2023-04-02 ENCOUNTER — Ambulatory Visit (INDEPENDENT_AMBULATORY_CARE_PROVIDER_SITE_OTHER): Payer: 59 | Admitting: Podiatry

## 2023-04-02 ENCOUNTER — Ambulatory Visit: Payer: 59

## 2023-04-02 DIAGNOSIS — M7751 Other enthesopathy of right foot: Secondary | ICD-10-CM

## 2023-04-02 DIAGNOSIS — M2012 Hallux valgus (acquired), left foot: Secondary | ICD-10-CM

## 2023-04-02 MED ORDER — TRIAMCINOLONE ACETONIDE 10 MG/ML IJ SUSP
10.0000 mg | Freq: Once | INTRAMUSCULAR | Status: AC
Start: 2023-04-02 — End: 2023-04-02
  Administered 2023-04-02: 10 mg via INTRA_ARTICULAR

## 2023-04-02 NOTE — Progress Notes (Signed)
Subjective:   Patient ID: [name], female   DOB: 57 y.o.   MRN: [id number]   HPI Patient presents stating a lot of pain around the second MPJ right foot and has pain in the left fifth metatarsal with lesion formation that can be hard to walk on   ROS      Objective:  Physical Exam  Vascular status intact with inflammation of the subfifth metatarsal left with lesion and inflammation around the second MPJ right foot     Assessment:  Inflammatory capsulitis second MPJ right foot along with tailor's bunion deformity left and hallux interphalange ES deformity left     Plan:  H&P reviewed both conditions ultimate surgery left may be necessary but today courtesy debridement accomplished and I went ahead and on the right I did periarticular injection 3 mg Dexasone Kenalog 5 mg Xylocaine to take pressure off the joint surface.  Reappoint 2 months May require more aggressive treatment

## 2023-04-23 ENCOUNTER — Other Ambulatory Visit: Payer: Self-pay | Admitting: Cardiology

## 2023-05-11 ENCOUNTER — Encounter (HOSPITAL_BASED_OUTPATIENT_CLINIC_OR_DEPARTMENT_OTHER): Payer: Self-pay | Admitting: Family

## 2023-05-11 ENCOUNTER — Other Ambulatory Visit (HOSPITAL_BASED_OUTPATIENT_CLINIC_OR_DEPARTMENT_OTHER): Payer: Self-pay

## 2023-05-11 ENCOUNTER — Ambulatory Visit (INDEPENDENT_AMBULATORY_CARE_PROVIDER_SITE_OTHER): Payer: 59 | Admitting: Family

## 2023-05-11 VITALS — BP 150/80 | HR 77 | Ht 66.0 in | Wt 183.0 lb

## 2023-05-11 DIAGNOSIS — E782 Mixed hyperlipidemia: Secondary | ICD-10-CM

## 2023-05-11 DIAGNOSIS — R002 Palpitations: Secondary | ICD-10-CM

## 2023-05-11 DIAGNOSIS — I10 Essential (primary) hypertension: Secondary | ICD-10-CM | POA: Diagnosis not present

## 2023-05-11 DIAGNOSIS — M7989 Other specified soft tissue disorders: Secondary | ICD-10-CM | POA: Diagnosis not present

## 2023-05-11 MED ORDER — AMLODIPINE BESYLATE 5 MG PO TABS
5.0000 mg | ORAL_TABLET | Freq: Every day | ORAL | 3 refills | Status: DC
Start: 2023-05-11 — End: 2024-04-09
  Filled 2023-05-11: qty 30, 30d supply, fill #0

## 2023-05-11 MED ORDER — METOPROLOL SUCCINATE ER 50 MG PO TB24
75.0000 mg | ORAL_TABLET | Freq: Every day | ORAL | 3 refills | Status: DC
Start: 2023-05-11 — End: 2023-08-03
  Filled 2023-05-11: qty 45, 30d supply, fill #0

## 2023-05-11 NOTE — Patient Instructions (Addendum)
Medication Instructions:  Your physician has recommended you make the following change in your medication:   Change: metoprolol Succinate 75mg  (1.5 tablet) daily   Change: Amlodipine 5mg  daily   *If you need a refill on your cardiac medications before your next appointment, please call your pharmacy*  Follow-Up: At [facility], you and your health needs are our priority.  As part of our continuing mission to provide you with exceptional heart care, we have created designated Provider Care Teams.  These Care Teams include your primary Cardiologist (physician) and Advanced Practice Providers (APPs -  Physician Assistants and Nurse Practitioners) who all work together to provide you with the care you need, when you need it.  We recommend signing up for the patient portal called "MyChart".  Sign up information is provided on this After Visit Summary.  MyChart is used to connect with patients for Virtual Visits (Telemedicine).  Patients are able to view lab/test results, encounter notes, upcoming appointments, etc.  Non-urgent messages can be sent to your provider as well.   To learn more about what you can do with MyChart, go to [email or web address].    Your next appointment:   2 months with Gillian Shields, NP   Other Instructions To prevent palpitations: Make sure you are adequately hydrated.  Avoid and/or limit caffeine containing beverages like soda or tea. Exercise regularly.  Manage stress well. Some over the counter medications can cause palpitations such as Benadryl, AdvilPM, TylenolPM. Regular Advil or Tylenol do not cause palpitations.

## 2023-05-18 ENCOUNTER — Encounter (HOSPITAL_BASED_OUTPATIENT_CLINIC_OR_DEPARTMENT_OTHER): Payer: Self-pay

## 2023-05-22 ENCOUNTER — Other Ambulatory Visit (HOSPITAL_BASED_OUTPATIENT_CLINIC_OR_DEPARTMENT_OTHER): Payer: Self-pay

## 2023-07-04 ENCOUNTER — Encounter (HOSPITAL_BASED_OUTPATIENT_CLINIC_OR_DEPARTMENT_OTHER): Payer: Self-pay

## 2023-07-05 MED ORDER — SPIRONOLACTONE 25 MG PO TABS: 12.5000 mg | ORAL_TABLET | Freq: Every day | ORAL | 3 refills | Status: DC

## 2023-07-06 ENCOUNTER — Other Ambulatory Visit (HOSPITAL_BASED_OUTPATIENT_CLINIC_OR_DEPARTMENT_OTHER): Payer: Self-pay | Admitting: Family

## 2023-07-06 MED ORDER — METOPROLOL TARTRATE 25 MG PO TABS: ORAL_TABLET | ORAL | 2 refills | Status: DC

## 2023-07-06 NOTE — Addendum Note (Signed)
Addended by: Mariam Dollar on: 07/06/2023 12:40 PM   Modules accepted: Orders

## 2023-08-03 ENCOUNTER — Ambulatory Visit (INDEPENDENT_AMBULATORY_CARE_PROVIDER_SITE_OTHER): Payer: 59 | Admitting: Family

## 2023-08-03 ENCOUNTER — Encounter (HOSPITAL_BASED_OUTPATIENT_CLINIC_OR_DEPARTMENT_OTHER): Payer: Self-pay | Admitting: Family

## 2023-08-03 VITALS — BP 150/82 | HR 72 | Ht 66.0 in | Wt 180.0 lb

## 2023-08-03 DIAGNOSIS — E782 Mixed hyperlipidemia: Secondary | ICD-10-CM

## 2023-08-03 DIAGNOSIS — I10 Essential (primary) hypertension: Secondary | ICD-10-CM

## 2023-08-03 DIAGNOSIS — R002 Palpitations: Secondary | ICD-10-CM

## 2023-08-03 MED ORDER — METOPROLOL SUCCINATE ER 50 MG PO TB24: ORAL_TABLET | ORAL | 3 refills | Status: AC

## 2023-08-03 NOTE — Patient Instructions (Addendum)
Medication Instructions:  Your physician has recommended you make the following change in your medication:   START Metoprolol Succinate 50 mg - take half tablet at lunch and one whole tablet at night CONTINUE Metoprolol Tartrate as needed for palpitations  *If you need a refill on your cardiac medications before your next appointment, please call your pharmacy*  Follow-Up: At [facility], you and your health needs are our priority.  As part of our continuing mission to provide you with exceptional heart care, we have created designated Provider Care Teams.  These Care Teams include your primary Cardiologist (physician) and Advanced Practice Providers (APPs -  Physician Assistants and Nurse Practitioners) who all work together to provide you with the care you need, when you need it.  We recommend signing up for the patient portal called "MyChart".  Sign up information is provided on this After Visit Summary.  MyChart is used to connect with patients for Virtual Visits (Telemedicine).  Patients are able to view lab/test results, encounter notes, upcoming appointments, etc.  Non-urgent messages can be sent to your provider as well.   To learn more about what you can do with MyChart, go to [email or web address].    Your next appointment:   3-4 month(s)  Provider:   Dr. Herbie Baltimore or Gillian Shields, NP

## 2023-08-03 NOTE — Progress Notes (Signed)
Cardiology Office Note:  .   Date:  08/03/2023  ID:  [name], DOB 1966/01/21, MRN [id number] PCP: Assunta Found, MD  [facility] Providers Cardiologist:  Bryan Lemma, MD    History of Present Illness: [name] Kitchen   [name] is a 57 y.o. female  with a hx of HTN, HLD, palpitations, edema, obesity.    ED visit 03/02/22 at [facility] for tachycardia. She did not stay to be evaluated. CMP, CBC unremarkable. Seen in follow up 02/2022 with PRN Metoprolol Tartrate added and monitor with NSR, rare PVC/PAC, and no significant arrhythmia.   Seen 11/01/22 by Dr. Herbie Baltimore with less cramping when taking Spironolactone 2-3 times per week still keeping edema controlled. Her Amlodipine was increased to 5mg  for BP control. She was congratulated on weight loss.   At last visit 05/11/23 after ED visit for palpitations Metoprolol succinate increased to 75mg  daily. BP elevated at home and recommended to take amlodipine 5 mg daily instead of as needed.  Presents today for follow up. BPs remain elevated at home, 140-150s/85-90s.  Notes headache with higher blood pressure.  Palpitations have been less bothersome.  Of note she is taking metoprolol succinate 50 mg in the evening and metoprolol tartrate 25 mg at lunchtime.  She did not recognize there was a difference between the metoprolol succinate and tartrate.  Continues to spironolactone as needed 2-3 times per week for swelling as reports leg cramps when she takes more routinely.    ROS: Please see the history of present illness.    All other systems reviewed and are negative.   Studies Reviewed: .        Cardiac Studies & Procedures     STRESS TESTS  MYOCARDIAL PERFUSION IMAGING 11/04/2009    MONITORS  LONG TERM MONITOR (3-14 DAYS) 03/30/2022  Narrative   14-day Zio Patch: 03/10/2022 - 8-06/2022   Predominant underlying rhythm: Sinus.  Heart rate range 46 to 149 bpm and average of 72 bpm.   Rare PACs and PVCs (<1%)   No couplets,  triplets or bigeminy/trigeminy   No sustained or nonsustained arrhythmias: Atrial Tachycardia (AT), Supraventricular Tachycardia (SVT), Atrial Fibrillation (A-Fib), Atrial Flutter (A-Flutter), Sustained Ventricular Tachycardia (VT)   Patient triggered episodes or either with sinus rhythm (or slow sinus tachycardia in the 110s), occasionally with PVCs or PACs.  All benign entities.  Overall Pretty NORMAL study.   This does not mean that the patient does not have any abnormal findings, but is none seen on this monitor.  Would not change management based on this.   Bryan Lemma, MD           Risk Assessment/Calculations:     HYPERTENSION CONTROL Vitals:   08/03/23 0824 08/03/23 0844  BP: 128/86 (!) 150/82    The patient's blood pressure is elevated above target today.  In order to address the patient's elevated BP: A current anti-hypertensive medication was adjusted today.; Follow up with general cardiology has been recommended.          Physical Exam:   VS:  BP (!) 150/82   Pulse 72   Ht 5\' 6"  (1.676 m)   Wt 180 lb (81.6 kg)   LMP 05/08/2017   SpO2 98%   BMI 29.05 kg/m     Vitals:   08/03/23 0824 08/03/23 0844  BP: 128/86 (!) 150/82  Pulse: 72   Height: 5\' 6"  (1.676 m)   Weight: 180 lb (81.6 kg)   SpO2: 98%   BMI (Calculated):  29.07     Wt Readings from Last 3 Encounters:  08/03/23 180 lb (81.6 kg)  05/11/23 183 lb (83 kg)  11/01/22 172 lb (78 kg)    GEN: Well nourished, well developed in no acute distress NECK: No JVD; No carotid bruits CARDIAC: RRR, no murmurs, rubs, gallops RESPIRATORY:  Clear to auscultation without rales, wheezing or rhonchi  ABDOMEN: Soft, non-tender, non-distended EXTREMITIES:  No edema; No deformity   ASSESSMENT AND PLAN: .    HTN - BP not at goal in the office or at home.  Initial BP 128/86 which is significantly lower than any of her home readings.  Repeat by provider 150/82.  She is taken metoprolol succinate 50 mg in the  evening and metoprolol tartrate 25 mg in the afternoon as she did not realize the difference.  She is hesitant to take all 75 mg metoprolol succinate 1 time.  Will instead recommend she take metoprolol succinate 25 mg at lunchtime and 50 mg in the evening.  We discussed transition from metoprolol to carvedilol but she prefers to start by adjusting metoprolol dose.  She is understandably frustrated by multiple medications.  Continue amlodipine 5 mg daily, defer increasing dose as previously had swelling with higher doses.  Continue olmesartan 40 mg daily.  Discussed to monitor BP at home at least 2 hours after medications and sitting for 5-10 minutes. If BP remains uncontrolled, plan to transition metoprolol succinate to carvedilol.   HLD, LDL goal <100 - LDL 79 12/01/22. Continue rosuvastatin.  Obesity -congratulated on continue weight loss.  Recommend aiming for 150 minutes of moderate intensity activity per week and following a heart healthy diet.   LE edema - Well controlled with prn spironolactone.  Discussed use of compression stockings during the  day when she is up walking around.   Palpitations - Monitor 03/2022 no significant arrhythmia.  As above, increase metoprolol succinate to 75 mg daily.  May use metoprolol tartrate 25 mg as needed for breakthrough palpitations.       Dispo: Follow-up in 3 to 4 months Signed, Alver Sorrow, NP

## 2023-10-09 ENCOUNTER — Other Ambulatory Visit: Payer: Self-pay | Admitting: Cardiology

## 2023-11-05 ENCOUNTER — Other Ambulatory Visit: Payer: Self-pay | Admitting: Cardiology

## 2023-12-26 ENCOUNTER — Encounter: Payer: Self-pay | Admitting: *Deleted

## 2024-02-07 ENCOUNTER — Other Ambulatory Visit: Payer: Self-pay | Admitting: Family Medicine

## 2024-02-07 DIAGNOSIS — N939 Abnormal uterine and vaginal bleeding, unspecified: Secondary | ICD-10-CM

## 2024-02-11 ENCOUNTER — Telehealth: Payer: Self-pay | Admitting: Cardiology

## 2024-02-11 MED ORDER — OLMESARTAN MEDOXOMIL 40 MG PO TABS: ORAL_TABLET | ORAL | 1 refills | Status: DC

## 2024-02-11 NOTE — Telephone Encounter (Signed)
*  STAT* If patient is at the pharmacy, call can be transferred to refill team.   1. Which medications need to be refilled? (please list name of each medication and dose if known)  olmesartan  (BENICAR ) 40 MG tablet  2. Which pharmacy/location (including street and city if local pharmacy) is medication to be sent to? [id number] - [place],  - [address]  3. Do they need a 30 day or 90 day supply?   90 day supply

## 2024-02-11 NOTE — Telephone Encounter (Signed)
 Pt's medication was sent to pt's pharmacy as requested. Confirmation received.

## 2024-02-18 ENCOUNTER — Other Ambulatory Visit (HOSPITAL_BASED_OUTPATIENT_CLINIC_OR_DEPARTMENT_OTHER): Payer: Self-pay | Admitting: Family Medicine

## 2024-02-18 ENCOUNTER — Ambulatory Visit (HOSPITAL_COMMUNITY)
Admission: RE | Admit: 2024-02-18 | Discharge: 2024-02-18 | Disposition: A | Discharge status: Home / Self Care | Source: Ambulatory Visit | Attending: Family Medicine | Admitting: Family Medicine

## 2024-02-18 DIAGNOSIS — M5416 Radiculopathy, lumbar region: Secondary | ICD-10-CM

## 2024-02-18 DIAGNOSIS — M25551 Pain in right hip: Secondary | ICD-10-CM | POA: Diagnosis present

## 2024-02-18 MED ORDER — GADOBUTROL 1 MMOL/ML IV SOLN
8.0000 mL | Freq: Once | INTRAVENOUS | Status: AC | PRN
  Administered 2024-02-18: 8 mL via INTRAVENOUS

## 2024-02-19 ENCOUNTER — Encounter: Payer: Self-pay | Admitting: Internal Medicine

## 2024-02-24 ENCOUNTER — Other Ambulatory Visit: Payer: Self-pay | Admitting: Cardiology

## 2024-04-03 ENCOUNTER — Encounter

## 2024-04-07 ENCOUNTER — Telehealth: Payer: Self-pay | Admitting: Internal Medicine

## 2024-04-07 NOTE — Telephone Encounter (Signed)
 Inbound call from patient wanting to schedule colonoscopy from recall, but Dr.Dorsey will be on maternity leave when patient wanted to be scheduled.who should we schedule patient with for procedure. Please advise for future scheduling  Thank you

## 2024-04-08 ENCOUNTER — Encounter: Payer: Self-pay | Admitting: Gastroenterology

## 2024-04-08 NOTE — Telephone Encounter (Signed)
 Called patient and left vm to call back to schedule colonoscopy. Thank you

## 2024-04-09 ENCOUNTER — Encounter: Payer: Self-pay | Admitting: Cardiology

## 2024-04-09 ENCOUNTER — Ambulatory Visit: Attending: Cardiology | Admitting: Cardiology

## 2024-04-09 VITALS — BP 146/86 | HR 61 | Ht 66.0 in | Wt 183.0 lb

## 2024-04-09 DIAGNOSIS — M7989 Other specified soft tissue disorders: Secondary | ICD-10-CM

## 2024-04-09 DIAGNOSIS — I10 Essential (primary) hypertension: Secondary | ICD-10-CM

## 2024-04-09 DIAGNOSIS — E782 Mixed hyperlipidemia: Secondary | ICD-10-CM

## 2024-04-09 DIAGNOSIS — I1A0 Resistant hypertension: Secondary | ICD-10-CM

## 2024-04-09 DIAGNOSIS — E785 Hyperlipidemia, unspecified: Secondary | ICD-10-CM | POA: Diagnosis not present

## 2024-04-09 DIAGNOSIS — R002 Palpitations: Secondary | ICD-10-CM

## 2024-04-09 MED ORDER — AMLODIPINE BESYLATE 10 MG PO TABS: 10.0000 mg | ORAL_TABLET | Freq: Every day | ORAL | 3 refills | Status: AC

## 2024-04-09 NOTE — Progress Notes (Signed)
 Cardiology Office Note:  .   Date:  04/13/2024  ID:  [name], DOB [birth year], MRN [id number] PCP: Marvine Rush, MD  [facility] Providers Cardiologist:  Alm Clay, MD Cardiology APP:  Vannie Reche RAMAN, NP     Chief Complaint  Patient presents with   Follow-up    Able to follow-up.  Doing well but blood pressure still elevated.   Hypertension    BP not well-controlled.    Patient Profile: .     [name] is a  58 y.o. African-American female  with a PMH notable for hypertension, palpitations and hyperlipidemia who presents here for delayed 66-month follow-up at the request of Marvine Rush, MD.    I last saw her back in 10/2022-she had reduced her spironolactone  to 2-3 times a week to reduce leg cramping but was still able to keep her edema under control.  I increased the amlodipine  to 5 mg daily.  I did note that she had lost some weight.  [name] was last seen by Reche Vannie, NP on 08/03/2023 for BP follow-up.  She had been to the ER on May 04, 2023 for palpitations and seen by [facility] on 27 September for follow-up.  Metoprolol  succinate was increased to 75 daily.  She was also told to take her amlodipine  5 mg daily and is mostly PRN.  During the December visit noted that the palpitations are less bothersome but blood pressure readings are still in the 140s 150s.  She was taking 50 mg Toprol  in the evening and 25 at lunchtime.  She did not notice an improvement in symptoms on succinate versus tartrate.  Still he spironolactone  25 mg to having 3 days a week. -> Recommendation was to take Toprol  opposite taking 25 months time and 50 in the evening and suggested the possibility of converting to carvedilol.  Did not titrate amlodipine  further because of concerns of edema in the past.  Continued on olmesartan  / Benicar  40 mg  daily.  Subjective  Discussed the use of AI scribe software for clinical note transcription with the patient,  who gave verbal consent to proceed.  History of Present Illness [name] is a 58 year old female with hypertension and palpitations who presents for a yearly checkup.  Her palpitations have improved since her last visit and are not as severe. She had previously visited the hospital for these symptoms. No recent chest pain, shortness of breath, or headaches.  She experiences some swelling in her legs, which is managed by taking spironolactone  a couple of times a week. However, taking spironolactone  leads to night cramps, particularly in her ankles. She also takes a potassium supplement.  Her blood pressure at home is typically around 140/89 to 92 mmHg. She experiences occasional dizziness when standing up but denies any headaches or blurred vision. She takes amlodipine , olmesartan , and metoprolol  daily for blood pressure management.  She has a history of high cholesterol, with the last lipid panel done in April 2024 showing total cholesterol of 155 mg/dL, LDL of 79 mg/dL, triglycerides of 89 mg/dL, and HDL of 58 mg/dL. She is unsure of any recent cholesterol checks.     Objective   Current Meds  Medication Sig   amLODipine  (NORVASC ) 5 MG tablet Take 1 tablet (5 mg total) by mouth daily.   Baclofen 5 MG TABS Take 1 tablet by mouth daily as needed.   Cholecalciferol (VITAMIN D3) 50 MCG (2000 UT) TABS Take 1 tablet by mouth  daily.   diphenhydrAMINE  (BENADRYL ) 25 MG tablet Take 25 mg by mouth every 6 (six) hours as needed.   KLOR-CON  M20 20 MEQ tablet TAKE 1 TABLET DAILY   levothyroxine (SYNTHROID) 50 MCG tablet Take 50 mcg by mouth daily.   LINZESS 290 MCG CAPS capsule Take 290 mcg by mouth as needed.   metoprolol  succinate (TOPROL -XL) 50 MG 24 hr tablet Take 0.5 tablets (25 mg total) by mouth lunchtime AND 1 tablet (50 mg total) at bedtime.   metoprolol  tartrate (LOPRESSOR ) 25 MG tablet TAKE 1 TABLET BY MOUTH 2 TIMES DAILY AS NEEDED (PALPITATIONS).   olmesartan  (BENICAR ) 40 MG  tablet TAKE 1 TABLET BY MOUTH EVERY DAY   rosuvastatin  (CRESTOR ) 20 MG tablet TAKE 1 TABLET (20 MG TOTAL) BY MOUTH DAILY.    spironolactone  (ALDACTONE ) 25 MG tablet Take 0.5 tablets (12.5 mg total) by mouth daily after lunch. - taking 2-3 d/week   Studies Reviewed: SABRA   EKG Interpretation Date/Time:  Wednesday April 09 2024 07:58:42 EDT Ventricular Rate:  61 PR Interval:  176 QRS Duration:  82 QT Interval:  446 QTC Calculation: 448 R Axis:   11  Text Interpretation: Normal sinus rhythm Normal ECG When compared with ECG of 11-May-2023 08:24, No significant change was found Confirmed by Anner Lenis ([id number]) on 04/09/2024 8:04:35 AM    [facility] Related to Comprehensive metabolic panel Component 02/03/24 12/20/23  Na 145 139  Potassium 3.9 3.3 Low   Cl 107 105  CO2 28 24  AGAP 10 10  Glucose 116 High  228 High   BUN 15 17  Creatinine 0.74 0.85  Ca 9.6 9.1  ALK PHOS 57 53  T Bili 0.6 0.4  Total Protein 7 6.8  Alb 4.6 4.2  GLOBULIN 2.4 2.6  ALT 27 21  AST 23 23   WBC 5.8 7.9  HGB 12.2 11.9  HCT 37.3 35.9  Plt Ct 192 192    Results LABS Total cholesterol: 155 mg/dL (95/80/7975) LDL: 79 mg/dL (95/80/7975) Triglycerides: 89 mg/dL (95/80/7975) HDL: 58 mg/dL (95/80/7975) Hemoglobin A1c: 5.3% (12/01/2022) MONITOR: 14-day Zio Patch: 03/10/2022 - 8-06/2022    Predominant underlying rhythm: Sinus.  Heart rate range 46 to 149 bpm and average of 72 bpm.   Rare PACs and PVCs (<1%)   No couplets, triplets or bigeminy/trigeminy   No sustained or nonsustained arrhythmias: Atrial Tachycardia (AT), Supraventricular Tachycardia (SVT), Atrial Fibrillation (A-Fib), Atrial Flutter (A-Flutter), Sustained Ventricular Tachycardia (VT)   Patient triggered episodes or either with sinus rhythm (or slow sinus tachycardia in the 110s), occasionally with PVCs or PACs.  All benign entities.  Risk Assessment/Calculations:     HYPERTENSION CONTROL Vitals:   04/09/24 0754 04/09/24 0818  BP:  (!) 150/90 (!) 146/86    The patient's blood pressure is elevated above target today. In order to address the patient's elevated BP: A current anti-hypertensive medication was adjusted today.; Blood pressure will be monitored at home to determine if medication changes need to be made. (increase amlodipine  to 10 mg)      Physical Exam:   VS:  BP (!) 146/86   Pulse 61   Ht 5' 6 (1.676 m)   Wt 183 lb (83 kg)   LMP 05/08/2017   SpO2 97%   BMI 29.54 kg/m    Wt Readings from Last 3 Encounters:  04/09/24 183 lb (83 kg)  08/03/23 180 lb (81.6 kg)  05/11/23 183 lb (83 kg)   GEN: Well nourished, well developed in no acute  distress; borderline obese NECK: No JVD; No carotid bruits CARDIAC: Normal S1, S2; RRR, no murmurs, rubs, gallops RESPIRATORY:  Clear to auscultation without rales, wheezing or rhonchi ; nonlabored, good air movement. ABDOMEN: Soft, non-tender, non-distended EXTREMITIES:  No edema; No deformity      ASSESSMENT AND PLAN: .    Problem List Items Addressed This Visit       Cardiology Problems   Hyperlipidemia with target LDL less than 100 (Chronic)   Hyperlipidemia well-controlled with previous cholesterol levels within target range. Current medication includes Crestor . - Order lipid panel to assess current cholesterol levels. - Continue Crestor  20 mg as prescribed.      Relevant Medications   amLODipine  (NORVASC ) 10 MG tablet   Other Relevant Orders   EKG 12-Lead (Completed)   Lipid panel   Resistant hypertension - Primary (Chronic)   Hypertension suboptimally controlled with home readings around 140/89-92 mmHg. Current regimen includes amlodipine , olmesartan , and metoprolol . Spironolactone  causes cramps when taken frequently. Blood pressure management crucial to prevent cardiovascular complications. - Increase amlodipine  to 10 mg daily, (starting with an additional 5 mg tonight) and then 10 mg at night from tomorrow. - Advise splitting olmesartan  40 mg daily  and amlodipine  10 mg doses between morning and evening with metoprolol  doses. - Continue taking Toprol  50 mg nightly and 25 mg at lunchtime - Continue spironolactone  12.5 mg 2-3 times a week trying to shoot for at least 3 days - Reassess blood pressure control in 6 months.      Relevant Medications   amLODipine  (NORVASC ) 10 MG tablet   Other Relevant Orders   EKG 12-Lead (Completed)   Lipid panel     Other   Palpitations (Chronic)   Well-controlled on current dose of Toprol  has not had to use additional dose of Lopressor .      Relevant Orders   Lipid panel   Swelling of both lower and upper extremities (Chronic)   Edema managed with intermittent spironolactone . Daily use causes cramps likely due to electrolyte imbalances. - Continue spironolactone  as needed for edema control, avoiding daily use to prevent cramps.-Usually takes spironolactone  12.5 mg 2-3 times a week.      Relevant Orders   Lipid panel        Follow-Up: Return in about 6 months (around 10/10/2024) for Alternate 6 month follow-up with APP & MD.     Signed, Alm MICAEL Clay, MD, MS Alm Clay, M.D., M.S. Interventional Chartered certified accountant  Pager # [phone]

## 2024-04-09 NOTE — Patient Instructions (Addendum)
 Medication Instructions:    START BY TAKING 10 MG TABLET       Take 5 mg tonight  and then start taking 10 mg amlodipine   tomorrow evening   *If you need a refill on your cardiac medications before your next appointment, please call your pharmacy*   Lab Work: LIPID   If you have labs (blood work) drawn today and your tests are completely normal, you will receive your results only by: MyChart Message (if you have MyChart) OR A paper copy in the mail If you have any lab test that is abnormal or we need to change your treatment, we will call you to review the results.   Testing/Procedures:   NOT NEEDED  Follow-Up: At [facility], you and your health needs are our priority.  As part of our continuing mission to provide you with exceptional heart care, we have created designated Provider Care Teams.  These Care Teams include your primary Cardiologist (physician) and Advanced Practice Providers (APPs -  Physician Assistants and Nurse Practitioners) who all work together to provide you with the care you need, when you need it.     Your next appointment:   6 month(s)  The format for your next appointment:   In Person  Provider:   CAITLIN WALKER  AND THEN 12 MONTHS Alm Clay, MD

## 2024-04-13 ENCOUNTER — Encounter: Payer: Self-pay | Admitting: Cardiology

## 2024-04-13 NOTE — Assessment & Plan Note (Signed)
 Well-controlled on current dose of Toprol  has not had to use additional dose of Lopressor .

## 2024-04-13 NOTE — Assessment & Plan Note (Signed)
 Hypertension suboptimally controlled with home readings around 140/89-92 mmHg. Current regimen includes amlodipine , olmesartan , and metoprolol . Spironolactone  causes cramps when taken frequently. Blood pressure management crucial to prevent cardiovascular complications. - Increase amlodipine  to 10 mg daily, (starting with an additional 5 mg tonight) and then 10 mg at night from tomorrow. - Advise splitting olmesartan  40 mg daily and amlodipine  10 mg doses between morning and evening with metoprolol  doses. - Continue taking Toprol  50 mg nightly and 25 mg at lunchtime - Continue spironolactone  12.5 mg 2-3 times a week trying to shoot for at least 3 days - Reassess blood pressure control in 6 months.

## 2024-04-13 NOTE — Assessment & Plan Note (Addendum)
 Hyperlipidemia well-controlled with previous cholesterol levels within target range. Current medication includes Crestor . - Order lipid panel to assess current cholesterol levels. - Continue Crestor  20 mg as prescribed.

## 2024-04-13 NOTE — Assessment & Plan Note (Signed)
 Edema managed with intermittent spironolactone . Daily use causes cramps likely due to electrolyte imbalances. - Continue spironolactone  as needed for edema control, avoiding daily use to prevent cramps.-Usually takes spironolactone  12.5 mg 2-3 times a week.

## 2024-04-14 ENCOUNTER — Other Ambulatory Visit (HOSPITAL_BASED_OUTPATIENT_CLINIC_OR_DEPARTMENT_OTHER): Payer: Self-pay | Admitting: Family

## 2024-04-17 ENCOUNTER — Encounter: Admitting: Internal Medicine

## 2024-04-24 ENCOUNTER — Encounter: Admitting: Internal Medicine

## 2024-04-28 ENCOUNTER — Ambulatory Visit (AMBULATORY_SURGERY_CENTER)

## 2024-04-28 VITALS — Ht 66.0 in | Wt 172.0 lb

## 2024-04-28 DIAGNOSIS — Z1211 Encounter for screening for malignant neoplasm of colon: Secondary | ICD-10-CM

## 2024-04-28 MED ORDER — NA SULFATE-K SULFATE-MG SULF 17.5-3.13-1.6 GM/177ML PO SOLN: 1.0000 | Freq: Once | ORAL | 0 refills | Status: DC

## 2024-04-28 MED ORDER — NA SULFATE-K SULFATE-MG SULF 17.5-3.13-1.6 GM/177ML PO SOLN: 1.0000 | Freq: Once | ORAL | 0 refills | Status: AC

## 2024-04-28 NOTE — Addendum Note (Signed)
 Addended by: ANN DERRAL CROME on: 04/28/2024 09:08 AM   Modules accepted: Orders

## 2024-04-28 NOTE — Progress Notes (Signed)
 No egg or soy allergy known to patient  No issues known to pt with past sedation with any surgeries or procedures Patient denies ever being told they had issues or difficulty with intubation  No FH of Malignant Hyperthermia Pt is not on diet pills Pt is not on  home 02  Pt is not on blood thinners  Constipation: yes taking linzess with additional laxative  No A fib or A flutter Have any cardiac testing pending-- no  LOA: independent  Prep: suprep   Patient's chart reviewed by Norleen Schillings CNRA prior to previsit and patient appropriate for the [facility].  Previsit completed and red dot placed by patient's name on their procedure day (on provider's schedule).     PV completed with patient. Prep instructions sent via mychart and home address.

## 2024-05-12 ENCOUNTER — Encounter: Admitting: Gastroenterology

## 2024-05-20 ENCOUNTER — Encounter: Payer: Self-pay | Admitting: Pediatrics

## 2024-05-23 NOTE — Progress Notes (Signed)
 Otolaryngology Clinic Note  HPI:    [name] is a 58 y.o. female who presents as a new patient.  Ms. Dura presents today for evaluation of ringing in her ears.  She states she has been aware of this for a few years.  She describes it as a bilateral ringing noise.  She does not give a history of chronic ear problems such as infections or surgery.  She did work in a Chemical engineer for about 15 years.  She was not required to wear hearing protection.  She is not aware of any family history of hearing loss.  PMH/Meds/All/SocHx/FamHx/ROS:   Medical History[1]  Surgical History[2]  No family history of bleeding disorders, wound healing problems or difficulty with anesthesia.      Current Medications[3]  A complete ROS was performed with pertinent positives/negatives noted in the HPI. The remainder of the ROS are negative.    Physical Exam:    Temp 97.3 F (36.3 C) (Temporal)   Ht 1.676 m (5' 6)   Wt 83.9 kg (185 lb)   BMI 29.86 kg/m   Constitutional:  Patient appears well-nourished and well-developed. No acute distress.   Head/Face: Facial features are symmetric. Skull is normocephalic. Hair and scalp are normal. Normal temporal artery pulses. TMJ shows no joint deformity swelling or erythema.   Eyes: Pupils are equal, round and reactive to light. Conjunctiva and lids are normal. Normal extraocular mobility. Normal vision by patient report.   Ears:     Right: Pinna and external meatus normal, normal ear canal skin and caliber without excessive cerumen or drainage. Tympanic membranes intact without effusion or infection.    Left: Pinna and external meatus normal, normal ear canal skin and caliber without excessive cerumen or drainage. Tympanic membranes intact without effusion or infection.    Nose/Sinus/Nasopharynx: Septum is normal. Normal nasal mucosa. Normal inferior turbinates.    Oral cavity/Oropharynx: Lips normal, teeth and gums normal  with good dentition, normal oral vestibule. Normal floor of mouth, tongue and oral mucosa, no mucosal lesions, ulcer or mass, normal tongue mobility.  Hard and soft palate normal with normal mobility. no erythema or exudate. Base of tongue, retromolar trigone and oral pharynx normal. Normal sensation, mobility and gag.   Neck: No cervical lymphadenopathy, mass or swelling. Salivary glands normal to palpation without swelling, erythema or mass. Normal facial nerve function. Normal thyroid  gland palpation.   Neurological: Alert and oriented to self, place and time.  Normal reflexes and motor skills, balance and coordination.   Psychiatric: No unusual anxiety or evidence of depression. Appropriate affect.     Independent Review of Additional Tests or Records:  Audiological evaluation: Audiometry: The right ear shows normal thresholds at 250 Hz and 500 Hz then downsloping to mild loss from 1000 Hz through 8000 Hz.  The left ear shows normal thresholds at 250 Hz through 1000 Hz then downsloping to mild loss from 2000 Hz through 8000 Hz. SRT's: 25 dB HL in both ears. Word recognition scores: Right ear shows 92%.  The left ear shows 88%.  Both with presentation levels of 65 dB HL with 45 dB EM. Speech intelligibility index: The right ear shows 69%.  The left ear shows 73%.  Procedures:  None   Impression & Plans:   1) sensorineural hearing loss-bilateral 2) tinnitus-bilateral   Hearing protection discussed Masking techniques discussed Patient is medically cleared for hearing aids May have hearing aid evaluation if desired Return to clinic 1 year audio first or as needed  if any significant changes noted  Alm RAMAN. Spainhour, PA-C [facility]        [1] Past Medical History: Diagnosis Date  . High cholesterol   . Hypertension   . Tachycardia   [2] Past Surgical History: Procedure Laterality Date  . CHOLECYSTECTOMY     Procedure: CHOLECYSTECTOMY  . FOOT SURGERY      Procedure: FOOT SURGERY  . NECK SURGERY     Procedure: NECK SURGERY  . TUBAL LIGATION     Procedure: TUBAL LIGATION  . WISDOM TOOTH EXTRACTION     Procedure: WISDOM TOOTH EXTRACTION  [3]  Current Outpatient Medications:  .  amLODIPine  (NORVASC ) 10 mg tablet, TAKE 1 TABLET BY MOUTH EVERY DAY, Disp: , Rfl:  .  baclofen (LIORESAL) 5 mg tablet, Take 5 mg by mouth nightly., Disp: 30 tablet, Rfl: 1 .  carBAMazepine (TEGretol-XR) 200 mg 12 hr tablet, Take 200 mg by mouth 2 (two) times a day., Disp: 60 tablet, Rfl: 11 .  cholecalciferol (VITAMIN D3) 1,250 mcg (50,000 unit) capsule, TAKE 1 CAPSULE ONCE A WEEK, Disp: 12 capsule, Rfl: 3 .  cyclobenzaprine (FLEXERIL) 10 mg tablet, Take 10 mg by mouth nightly as needed., Disp: , Rfl:  .  diphenhydrAMINE  (BENADRYL ) 25 mg tablet, Take  by mouth as needed., Disp: , Rfl:  .  escitalopram (LEXAPRO) 10 mg tablet, Take 10 mg by mouth as needed., Disp: , Rfl:  .  escitalopram (LEXAPRO) 5 mg tablet, Take 5 mg by mouth daily., Disp: , Rfl:  .  Klor-Con  M20 20 mEq ER tablet, TAKE 1 TABLET BY MOUTH DAILY. PLEASE SCHEDULE ANNUAL APPT WITH DR. HARDING FOR REFILLS. [phone], Disp: , Rfl:  .  levothyroxine (SYNTHROID) 50 mcg tablet, Take 50 mcg by mouth Once Daily., Disp: , Rfl:  .  Linzess 290 mcg cap capsule, Take 290 mcg by mouth daily as needed., Disp: , Rfl:  .  metoprolol  succinate (TOPROL  XL) 50 mg 24 hr tablet, TAKE 1 TABLET BY MOUTH EVERY DAY, Disp: , Rfl:  .  metoprolol  tartrate (LOPRESSOR ) 25 mg tablet, Take 25 mg by mouth 2 (two) times a day., Disp: , Rfl:  .  metoprolol  tartrate (LOPRESSOR ) 50 mg tablet, Take 50 mg by mouth daily. with food, Disp: , Rfl:  .  olmesartan  (BENICAR ) 40 mg tablet, TAKE 1 TABLET BY MOUTH EVERY DAY, Disp: , Rfl:  .  ondansetron  (ZOFRAN ) 4 mg tablet, Take 4 mg by mouth as needed., Disp: , Rfl:  .  oxyCODONE -acetaminophen  (PERCOCET) 10-325 mg per tablet, , Disp: , Rfl:  .  rosuvastatin  (CRESTOR ) 20 mg tablet, Take 20 mg by  mouth Once Daily., Disp: , Rfl:  .  spironolactone  (ALDACTONE ) 25 mg tablet, Take 25 mg by mouth as needed., Disp: , Rfl:  .  zolpidem (AMBIEN) 10 mg tablet, Take 10 mg by mouth nightly as needed., Disp: , Rfl:

## 2024-05-27 NOTE — Progress Notes (Unsigned)
  Gastroenterology History and Physical   Primary Care Physician:  Rolinda Millman, MD   Reason for Procedure:  Colorectal cancer screening  Plan:    Screening colonoscopy     HPI: [name] is a 58 y.o. female undergoing screening colonoscopy for colorectal cancer screening.  Patient's last colonoscopy was performed in 2015 and was normal.  Noted to have a redundant left colon with overtube use.  No family history of colorectal cancer or polyps.  Patient denies current symptoms of change in bowel habits or rectal bleeding.   Past Medical History:  Diagnosis Date   Constipation - functional    Ectopic pregnancy    Essential hypertension    Hyperlipidemia LDL goal <130    Obesity    Palpitations    Normal echocardiogram with no valvular lesions. Normal nuclear stress test in March 2011 and August 2014   Stress incontinence    Thyroid  disease    isolated, one month only, not on synthroid since 2013    Past Surgical History:  Procedure Laterality Date   CardioNet Monitor  July 2014    Sinus rhythm, sinus tachycardia.  Shortness of breath, dizziness, and chest pain noted with sinus tachycardia   CHOLECYSTECTOMY     COLONOSCOPY N/A 02/06/2014   SLF:1. Normal mucosa in the terrminal ileum 2. The LEFT colon is redundant 3. The colon mucosa was otherwise normal 4. Moderate sizxed internal hemorrhoids 5. No source for change in the bowel habits identified. constipation most likely due to functional constipation.    DOPPLER ECHOCARDIOGRAPHY  March 2011; July 2014   Normal EF, essentially normal; no valvular lesions.  No PFO or ASD   LAPAROSCOPIC ENDOMETRIOSIS FULGURATION  1990   Treadmill Myoview Nuclear Stress Test  March 2011, August 2014   a) 2011: 6 minutes, 7 METS; Diaphragmatic attenuation otherwise no evidence of ischemia or infarction; b) 2014: Walked at 9 min, 10.1 METs, no ischemia or infarction with normal EF 66%. No EKG changes.    Prior to Admission  medications   Medication Sig Start Date End Date Taking? Authorizing Provider  amLODipine  (NORVASC ) 10 MG tablet Take 1 tablet (10 mg total) by mouth daily. 04/09/24 07/08/24  Anner Alm ORN, MD  Baclofen 5 MG TABS Take 1 tablet by mouth daily as needed. 10/20/21   [provider]  Cholecalciferol (VITAMIN D3) 50 MCG (2000 UT) TABS Take 1 tablet by mouth daily. 10/25/21   [provider]  dexamethasone (DECADRON) 0.1 % ophthalmic solution Place 1 drop into both eyes as needed.    [provider]  diphenhydrAMINE  (BENADRYL ) 25 MG tablet Take 25 mg by mouth every 6 (six) hours as needed.    [provider]  escitalopram (LEXAPRO) 10 MG tablet Take 10 mg by mouth as needed.    [provider]  KLOR-CON  M20 20 MEQ tablet TAKE 1 TABLET DAILY 10/09/23   Anner Alm ORN, MD  levothyroxine (SYNTHROID) 50 MCG tablet Take 50 mcg by mouth daily. 12/10/19   [provider]  LINZESS 290 MCG CAPS capsule Take 290 mcg by mouth as needed. 01/07/22   [provider]  metoprolol  succinate (TOPROL -XL) 50 MG 24 hr tablet Take 0.5 tablets (25 mg total) by mouth daily AND 1 tablet (50 mg total) at bedtime. 08/03/23   Vannie Reche RAMAN, NP  metoprolol  tartrate (LOPRESSOR ) 25 MG tablet TAKE 1 TABLET BY MOUTH 2 TIMES DAILY AS NEEDED (PALPITATIONS). 04/15/24   Anner Alm ORN, MD  olmesartan  (BENICAR )  40 MG tablet TAKE 1 TABLET BY MOUTH EVERY DAY 02/11/24   Walker, Caitlin S, NP  rosuvastatin  (CRESTOR ) 20 MG tablet TAKE 1 TABLET (20 MG TOTAL) BY MOUTH DAILY. PLEASE KEEP SCHEDULED APPOINTMENT WITH CARDIOLOGIST 02/25/24   Anner Alm ORN, MD  spironolactone  (ALDACTONE ) 25 MG tablet TAKE 0.5 TABLETS (12.5 MG TOTAL) BY MOUTH DAILY AFTER LUNCH. 04/15/24   Anner Alm ORN, MD    Current Outpatient Medications  Medication Sig Dispense Refill   amLODipine  (NORVASC ) 10 MG tablet Take 1 tablet (10 mg total) by mouth daily. 90 tablet 3   Baclofen 5 MG TABS Take 1 tablet by  mouth daily as needed.     Cholecalciferol (VITAMIN D3) 50 MCG (2000 UT) TABS Take 1 tablet by mouth daily.     diphenhydrAMINE  (BENADRYL ) 25 MG tablet Take 25 mg by mouth every 6 (six) hours as needed.     KLOR-CON  M20 20 MEQ tablet TAKE 1 TABLET DAILY 90 tablet 3   levothyroxine (SYNTHROID) 50 MCG tablet Take 50 mcg by mouth daily.     LINZESS 290 MCG CAPS capsule Take 290 mcg by mouth as needed.     metoprolol  succinate (TOPROL -XL) 50 MG 24 hr tablet Take 0.5 tablets (25 mg total) by mouth daily AND 1 tablet (50 mg total) at bedtime. 135 tablet 3   metoprolol  tartrate (LOPRESSOR ) 25 MG tablet TAKE 1 TABLET BY MOUTH 2 TIMES DAILY AS NEEDED (PALPITATIONS). 180 tablet 3   olmesartan  (BENICAR ) 40 MG tablet TAKE 1 TABLET BY MOUTH EVERY DAY 90 tablet 1   rosuvastatin  (CRESTOR ) 20 MG tablet TAKE 1 TABLET (20 MG TOTAL) BY MOUTH DAILY. PLEASE KEEP SCHEDULED APPOINTMENT WITH CARDIOLOGIST 60 tablet 0   spironolactone  (ALDACTONE ) 25 MG tablet TAKE 0.5 TABLETS (12.5 MG TOTAL) BY MOUTH DAILY AFTER LUNCH. 45 tablet 3   dexamethasone (DECADRON) 0.1 % ophthalmic solution Place 1 drop into both eyes as needed.     escitalopram (LEXAPRO) 10 MG tablet Take 10 mg by mouth as needed.     Current Facility-Administered Medications  Medication Dose Route Frequency Provider Last Rate Last Admin   0.9 %  sodium chloride  infusion  500 mL Intravenous Continuous Reniah Cottingham, Inocente HERO, MD        Allergies as of 05/29/2024 - Review Complete 05/29/2024  Allergen Reaction Noted   Doxycycline Itching and Rash     Family History  Problem Relation Age of Onset   Hypertension Mother    Heart disease Mother    Hypertension Father    Colon cancer Neg Hx    Liver disease Neg Hx    Rectal cancer Neg Hx    Stomach cancer Neg Hx     Social History   Socioeconomic History   Marital status: Married    Spouse name: Not on file   Number of children: 2   Years of education: College   Highest education level: Not on file   Occupational History   Occupation: Nutritional therapist: OTHER    Comment: [employer]  Tobacco Use   Smoking status: Never   Smokeless tobacco: Never  Substance and Sexual Activity   Alcohol use: No    Alcohol/week: 0.0 standard drinks of alcohol   Drug use: No   Sexual activity: Never  Other Topics Concern   Not on file  Social History Narrative   Remain since her last visit. Her husband and is [place].      Is now working on exercising  regularly doing walks for 45 to 60 minutes a day 5 days a week.   Social Drivers of Corporate investment banker Strain: Not on file  Food Insecurity: Not on file  Transportation Needs: Not on file  Physical Activity: Not on file  Stress: Not on file  Social Connections: Unknown (12/27/2021)   Received from [facility]   Social Network    Social Network: Not on file  Intimate Partner Violence: Not At Risk (02/03/2024)   Received from [facility]   HITS    Over the last 12 months how often did your partner physically hurt you?: Never    Over the last 12 months how often did your partner insult you or talk down to you?: Never    Over the last 12 months how often did your partner threaten you with physical harm?: Never    Over the last 12 months how often did your partner scream or curse at you?: Never    Review of Systems:  All other review of systems negative except as mentioned in the HPI.  Physical Exam: Vital signs BP 108/66   Pulse 61   Temp (!) 97 F (36.1 C) (Temporal)   Resp 17   Ht 5' 6 (1.676 m)   Wt 172 lb (78 kg)   LMP 05/08/2017   SpO2 100%   BMI 27.76 kg/m   General:   Alert,  Well-developed, well-nourished, pleasant and cooperative in NAD Airway:  Mallampati 2 Lungs:  Clear throughout to auscultation.   Heart:  Regular rate and rhythm; no murmurs, clicks, rubs,  or gallops. Abdomen:  Soft, nontender and nondistended. Normal bowel sounds.   Neuro/Psych:  Normal mood and affect. A and O x  3  Inocente Hausen, MD [facility]

## 2024-05-29 ENCOUNTER — Ambulatory Visit: Admitting: Pediatrics

## 2024-05-29 ENCOUNTER — Encounter: Payer: Self-pay | Admitting: Pediatrics

## 2024-05-29 VITALS — BP 102/50 | HR 78 | Temp 97.0°F | Resp 15 | Ht 66.0 in | Wt 172.0 lb

## 2024-05-29 DIAGNOSIS — D175 Benign lipomatous neoplasm of intra-abdominal organs: Secondary | ICD-10-CM | POA: Diagnosis not present

## 2024-05-29 DIAGNOSIS — K648 Other hemorrhoids: Secondary | ICD-10-CM

## 2024-05-29 DIAGNOSIS — Q439 Congenital malformation of intestine, unspecified: Secondary | ICD-10-CM

## 2024-05-29 DIAGNOSIS — Z1211 Encounter for screening for malignant neoplasm of colon: Secondary | ICD-10-CM | POA: Diagnosis present

## 2024-05-29 DIAGNOSIS — D123 Benign neoplasm of transverse colon: Secondary | ICD-10-CM

## 2024-05-29 DIAGNOSIS — K5730 Diverticulosis of large intestine without perforation or abscess without bleeding: Secondary | ICD-10-CM | POA: Diagnosis not present

## 2024-05-29 MED ORDER — SODIUM CHLORIDE 0.9 % IV SOLN: 500.0000 mL | INTRAVENOUS | Status: DC

## 2024-05-29 NOTE — Progress Notes (Signed)
 Sedate, gd SR, tolerated procedure well, VSS, report to RN

## 2024-05-29 NOTE — Op Note (Signed)
 [facility] Patient Name: [name] Procedure Date: 05/29/2024 8:37 AM MRN: [id number] Endoscopist: Inocente Hausen , MD, [id number] Age: 58 Referring MD:  Date of Birth: Jan 11, 1966 Gender: Female Account #: [id number] Procedure:                Colonoscopy Indications:              Screening for colorectal malignant neoplasm, Last                            colonoscopy: 2015 Medicines:                Monitored Anesthesia Care Procedure:                Pre-Anesthesia Assessment:                           - Prior to the procedure, a History and Physical                            was performed, and patient medications and                            allergies were reviewed. The patient's tolerance of                            previous anesthesia was also reviewed. The risks                            and benefits of the procedure and the sedation                            options and risks were discussed with the patient.                            All questions were answered, and informed consent                            was obtained. Prior Anticoagulants: The patient has                            taken no anticoagulant or antiplatelet agents. ASA                            Grade Assessment: II - A patient with mild systemic                            disease. After reviewing the risks and benefits,                            the patient was deemed in satisfactory condition to                            undergo the procedure.  After obtaining informed consent, the colonoscope                            was passed under direct vision. Throughout the                            procedure, the patient's blood pressure, pulse, and                            oxygen saturations were monitored continuously. The                            Olympus CF-HQ190L ([id number]) Colonoscope was                            introduced through the anus and  advanced to the                            cecum, identified by appendiceal orifice and                            ileocecal valve. The colonoscopy was somewhat                            difficult due to a redundant colon, significant                            looping and a tortuous colon. The patient tolerated                            the procedure well. An abdominal binder was used to                            facilitate the procedure. The quality of the bowel                            preparation was good. The ileocecal valve,                            appendiceal orifice, and rectum were photographed. Scope In: 8:49:04 AM Scope Out: 9:11:57 AM Scope Withdrawal Time: 0 hours 14 minutes 56 seconds  Total Procedure Duration: 0 hours 22 minutes 53 seconds  Findings:                 Hemorrhoids were found on perianal exam.                           The digital rectal exam was normal. Pertinent                            negatives include normal sphincter tone and no                            palpable rectal lesions.  Multiple small-mouthed diverticula were found in                            the sigmoid colon and descending colon.                           Two sessile polyps were found in the hepatic                            flexure. The polyps were 3 to 4 mm in size. These                            polyps were removed with a cold biopsy forceps.                            Resection and retrieval were complete.                           The left colon was significantly tortuous.                           Internal hemorrhoids were found during retroflexion.                           Small lipoma in cecum. Complications:            No immediate complications. Estimated blood loss:                            Minimal. Estimated Blood Loss:     Estimated blood loss was minimal. Impression:               - Hemorrhoids found on perianal exam.                            - Diverticulosis in the sigmoid colon and in the                            descending colon.                           - Two 3 to 4 mm polyps at the hepatic flexure,                            removed with a cold biopsy forceps. Resected and                            retrieved.                           - Tortuous colon.                           - Internal hemorrhoids.                           -  Small lipoma in cecum. Recommendation:           - Discharge patient to home (ambulatory).                           - Await pathology results.                           - Repeat colonoscopy for surveillance based on                            pathology results.                           - The findings and recommendations were discussed                            with the patient's family.                           - Patient has a contact number available for                            emergencies. The signs and symptoms of potential                            delayed complications were discussed with the                            patient. Return to normal activities tomorrow.                            Written discharge instructions were provided to the                            patient. Inocente Hausen, MD 05/29/2024 9:18:09 AM This report has been signed electronically.

## 2024-05-29 NOTE — Patient Instructions (Signed)

## 2024-05-29 NOTE — Progress Notes (Signed)
 Called to room to assist during endoscopic procedure.  Patient ID and intended procedure confirmed with present staff. Received instructions for my participation in the procedure from the performing physician.

## 2024-05-30 ENCOUNTER — Telehealth: Payer: Self-pay | Admitting: *Deleted

## 2024-05-30 NOTE — Telephone Encounter (Signed)
 No answer on follow up call. Left message.

## 2024-06-02 ENCOUNTER — Ambulatory Visit: Payer: Self-pay | Admitting: Pediatrics

## 2024-06-02 LAB — SURGICAL PATHOLOGY

## 2024-07-04 LAB — LIPID PANEL
Chol/HDL Ratio: 3 ratio (ref 0.0–4.4)
Cholesterol, Total: 157 mg/dL (ref 100–199)
HDL: 53 mg/dL (ref >39)
LDL Chol Calc (NIH): 85 mg/dL (ref 0–99)
Triglycerides: 105 mg/dL (ref 0–149)
VLDL Cholesterol Cal: 19 mg/dL (ref 5–40)

## 2024-07-12 ENCOUNTER — Ambulatory Visit: Payer: Self-pay | Admitting: Cardiology

## 2024-07-19 ENCOUNTER — Other Ambulatory Visit: Payer: Self-pay | Admitting: Cardiology

## 2024-08-05 ENCOUNTER — Other Ambulatory Visit: Payer: Self-pay | Admitting: Family

## 2024-08-18 ENCOUNTER — Encounter: Payer: Self-pay | Admitting: Cardiology

## 2024-08-25 ENCOUNTER — Telehealth: Payer: Self-pay | Admitting: Family

## 2024-08-25 NOTE — Telephone Encounter (Signed)
 Left detailed message per patient request.  Adv of message from [place] in previous note, including to call back to schedule an appointment either for late Feb or sooner if needed.

## 2024-08-25 NOTE — Telephone Encounter (Signed)
 Pt would like a c/b as to why her appt for tomorrow was cancelled. Pt states that a message can be left if she does not answer being that she is currently at work. Please advise

## 2024-08-25 NOTE — Telephone Encounter (Signed)
 Appears appt scheduled in error. She sent MyChart 08/19/24 with palpitations and was encouraged to utilize PRN metoprolol  tartrate. If still experiencing significant palpitations, recommend scheduling OV with Dr. Anner or APP (last recommended for follow up around 10/10/2024 but could be moved sooner if experiencing symptoms).  Margrett Kalb S Cheyane Ayon, NP

## 2024-08-25 NOTE — Telephone Encounter (Signed)
 Patient called again asking why her appt was cancel.  I explained to her it looked like the appt was made in error as it was cancel within two mins after it was made, and marked as error by nurse.

## 2024-08-26 ENCOUNTER — Ambulatory Visit (HOSPITAL_BASED_OUTPATIENT_CLINIC_OR_DEPARTMENT_OTHER): Admitting: Family

## 2024-08-26 NOTE — Telephone Encounter (Signed)
 Left message stating that it looks like the appt was canceled 2 minutes after it was made, maybe in error? Also in the notes it looks like you were going to start using the Metoprolol  Tartrate 25 mg twice a day for high heart rates and I wanted to see how this is working for you; also want to see if you have been keeping a BP log? Asked her to call and/or [facility] message with this information and we can schedule an appt if needed.   Also sent [facility] message
